# Patient Record
Sex: Female | Born: 1970 | Race: Asian | Hispanic: No | Smoking: Never smoker
Health system: Southern US, Community
[De-identification: ages and names within clinical notes are randomized; demographics above are authoritative.]

## PROBLEM LIST (undated history)

## (undated) ENCOUNTER — Emergency Department (HOSPITAL_COMMUNITY): Discharge status: Absent without leave | Payer: Self-pay

## (undated) DIAGNOSIS — K219 Gastro-esophageal reflux disease without esophagitis: Secondary | ICD-10-CM

## (undated) DIAGNOSIS — T7840XA Allergy, unspecified, initial encounter: Secondary | ICD-10-CM

## (undated) DIAGNOSIS — C801 Malignant (primary) neoplasm, unspecified: Secondary | ICD-10-CM

## (undated) DIAGNOSIS — R233 Spontaneous ecchymoses: Secondary | ICD-10-CM

## (undated) DIAGNOSIS — B1910 Unspecified viral hepatitis B without hepatic coma: Secondary | ICD-10-CM

## (undated) DIAGNOSIS — K649 Unspecified hemorrhoids: Secondary | ICD-10-CM

## (undated) DIAGNOSIS — E079 Disorder of thyroid, unspecified: Secondary | ICD-10-CM

## (undated) DIAGNOSIS — R200 Anesthesia of skin: Secondary | ICD-10-CM

## (undated) DIAGNOSIS — R238 Other skin changes: Secondary | ICD-10-CM

## (undated) DIAGNOSIS — R0602 Shortness of breath: Secondary | ICD-10-CM

## (undated) HISTORY — DX: Unspecified hemorrhoids: K64.9

## (undated) HISTORY — DX: Allergy, unspecified, initial encounter: T78.40XA

## (undated) HISTORY — DX: Unspecified viral hepatitis B without hepatic coma: B19.10

## (undated) HISTORY — DX: Disorder of thyroid, unspecified: E07.9

## (undated) HISTORY — DX: Malignant (primary) neoplasm, unspecified: C80.1

## (undated) HISTORY — PX: OTHER SURGICAL HISTORY: SHX169

---

## 1999-05-02 ENCOUNTER — Other Ambulatory Visit: Admission: RE | Admit: 1999-05-02 | Discharge: 1999-05-02 | Payer: Self-pay | Admitting: *Deleted

## 2000-04-26 ENCOUNTER — Other Ambulatory Visit: Admission: RE | Admit: 2000-04-26 | Discharge: 2000-04-26 | Payer: Self-pay | Admitting: *Deleted

## 2001-05-30 ENCOUNTER — Other Ambulatory Visit: Admission: RE | Admit: 2001-05-30 | Discharge: 2001-05-30 | Payer: Self-pay | Admitting: Obstetrics and Gynecology

## 2002-06-23 ENCOUNTER — Other Ambulatory Visit: Admission: RE | Admit: 2002-06-23 | Discharge: 2002-06-23 | Payer: Self-pay | Admitting: Obstetrics and Gynecology

## 2003-05-04 ENCOUNTER — Encounter: Admission: RE | Admit: 2003-05-04 | Discharge: 2003-05-04 | Payer: Self-pay | Admitting: *Deleted

## 2003-05-04 ENCOUNTER — Encounter: Payer: Self-pay | Admitting: Gastroenterology

## 2003-05-11 ENCOUNTER — Encounter: Admission: RE | Admit: 2003-05-11 | Discharge: 2003-05-11 | Payer: Self-pay | Admitting: Gastroenterology

## 2006-11-05 ENCOUNTER — Encounter: Admission: RE | Admit: 2006-11-05 | Discharge: 2006-11-05 | Payer: Self-pay | Admitting: Gastroenterology

## 2007-01-29 ENCOUNTER — Ambulatory Visit: Payer: Self-pay | Admitting: Gastroenterology

## 2007-03-06 ENCOUNTER — Ambulatory Visit (HOSPITAL_COMMUNITY): Admission: RE | Admit: 2007-03-06 | Discharge: 2007-03-06 | Payer: Self-pay | Admitting: Gastroenterology

## 2008-11-19 ENCOUNTER — Encounter: Admission: RE | Admit: 2008-11-19 | Discharge: 2008-11-19 | Payer: Self-pay | Admitting: Family Medicine

## 2008-12-09 ENCOUNTER — Encounter: Admission: RE | Admit: 2008-12-09 | Discharge: 2008-12-09 | Payer: Self-pay | Admitting: Family Medicine

## 2010-04-08 ENCOUNTER — Ambulatory Visit: Payer: Self-pay | Admitting: Family Medicine

## 2010-04-08 LAB — CONVERTED CEMR LAB
CO2: 22 meq/L (ref 19–32)
Calcium: 9 mg/dL (ref 8.4–10.5)
Chloride: 104 meq/L (ref 96–112)
Creatinine, Ser: 0.5 mg/dL (ref 0.40–1.20)
Eosinophils Absolute: 0.1 10*3/uL (ref 0.0–0.7)
Eosinophils Relative: 1 % (ref 0–5)
Free T4: 1.04 ng/dL (ref 0.80–1.80)
Glucose, Bld: 78 mg/dL (ref 70–99)
HCT: 39.3 % (ref 36.0–46.0)
Hemoglobin: 13 g/dL (ref 12.0–15.0)
Lymphs Abs: 2.7 10*3/uL (ref 0.7–4.0)
MCV: 87.1 fL (ref 78.0–100.0)
Monocytes Relative: 6 % (ref 3–12)
RBC: 4.51 M/uL (ref 3.87–5.11)
Sodium: 137 meq/L (ref 135–145)
TSH: 0.636 microintl units/mL (ref 0.350–4.500)
Total Bilirubin: 0.4 mg/dL (ref 0.3–1.2)
Total Protein: 7.4 g/dL (ref 6.0–8.3)
Vit D, 25-Hydroxy: 16 ng/mL — ABNORMAL LOW (ref 30–89)
WBC: 6 10*3/uL (ref 4.0–10.5)

## 2010-05-20 ENCOUNTER — Encounter (INDEPENDENT_AMBULATORY_CARE_PROVIDER_SITE_OTHER): Payer: Self-pay | Admitting: *Deleted

## 2010-05-20 LAB — CONVERTED CEMR LAB
ALT: 9 units/L (ref 0–35)
BUN: 8 mg/dL (ref 6–23)
CO2: 26 meq/L (ref 19–32)
Calcium: 9.4 mg/dL (ref 8.4–10.5)
Creatinine, Ser: 0.55 mg/dL (ref 0.40–1.20)
Total Bilirubin: 0.8 mg/dL (ref 0.3–1.2)

## 2011-03-21 ENCOUNTER — Inpatient Hospital Stay (INDEPENDENT_AMBULATORY_CARE_PROVIDER_SITE_OTHER)
Admission: RE | Admit: 2011-03-21 | Discharge: 2011-03-21 | Disposition: A | Discharge status: Home / Self Care | Payer: Self-pay | Source: Ambulatory Visit | Attending: Emergency Medicine | Admitting: Emergency Medicine

## 2011-03-21 DIAGNOSIS — R198 Other specified symptoms and signs involving the digestive system and abdomen: Secondary | ICD-10-CM

## 2011-03-21 LAB — OCCULT BLOOD, POC DEVICE: Fecal Occult Bld: NEGATIVE

## 2011-06-30 ENCOUNTER — Other Ambulatory Visit: Payer: Self-pay | Admitting: Family Medicine

## 2011-10-12 ENCOUNTER — Other Ambulatory Visit: Payer: Self-pay | Admitting: Obstetrics and Gynecology

## 2011-10-12 DIAGNOSIS — Z1231 Encounter for screening mammogram for malignant neoplasm of breast: Secondary | ICD-10-CM

## 2011-10-17 ENCOUNTER — Ambulatory Visit (INDEPENDENT_AMBULATORY_CARE_PROVIDER_SITE_OTHER): Payer: Self-pay | Admitting: *Deleted

## 2011-10-17 ENCOUNTER — Ambulatory Visit (HOSPITAL_COMMUNITY)
Admission: RE | Admit: 2011-10-17 | Discharge: 2011-10-17 | Disposition: A | Discharge status: Home / Self Care | Payer: Self-pay | Source: Ambulatory Visit | Attending: Obstetrics and Gynecology | Admitting: Obstetrics and Gynecology

## 2011-10-17 VITALS — BP 141/95 | HR 85 | Temp 97.1°F | Ht 62.0 in | Wt 134.9 lb

## 2011-10-17 DIAGNOSIS — Z1239 Encounter for other screening for malignant neoplasm of breast: Secondary | ICD-10-CM

## 2011-10-17 DIAGNOSIS — Z1231 Encounter for screening mammogram for malignant neoplasm of breast: Secondary | ICD-10-CM

## 2011-10-17 NOTE — Patient Instructions (Signed)
Taught patient how to perform BSE and gave educational materials to take home. Patient did not need a Pap smear today due to last Pap smear was 06/30/11. Told patient about free cervical cancer screenings to receive a Pap smear if would like one next year. Let her know [facility] will cover Pap smears every 3 years unless has a history of abnormal Pap smears. Patient is escorted to mammography for a screening mammogram. Talked with patient about her BP. Patients BP was 139/91. Encouraged patient to keep a log of her BP's and have taken several times. Gave patient a sheet to use to keep log. Also, talked with patient about increasing physical activity and watching salt intake. Encouraged patient to talk to her PCP about BP.  Let patient know will follow up with her within the next couple weeks with results. Patient verbalized understanding.

## 2011-10-17 NOTE — Progress Notes (Signed)
Complaints of occasional breast tenderness.  Pap Smear:    Pap smear not performed today. Patients last Pap smear was 06/30/11 at [facility] and was normal. Per patient she has no history of abnormal Pap smears. Pap smear result above is in EPIC.  Physical exam: Breasts Breasts symmetrical. No skin abnormalities bilateral breasts. Right nipple inversion that is normal per patient. No nipple retraction left breast. No nipple discharge bilateral breasts. No lymphadenopathy. No lumps palpated bilateral breasts. Patient complained of tenderness of bilateral breasts on exam.       Pelvic/Bimanual No Pap smear completed today since last Pap smear was 06/30/11 and normal. Pap smear not indicated per [facility] guidelines.

## 2012-10-18 ENCOUNTER — Ambulatory Visit (INDEPENDENT_AMBULATORY_CARE_PROVIDER_SITE_OTHER): Payer: Self-pay | Admitting: Obstetrics and Gynecology

## 2012-10-18 ENCOUNTER — Encounter: Payer: Self-pay | Admitting: Obstetrics and Gynecology

## 2012-10-18 ENCOUNTER — Encounter: Payer: Self-pay | Admitting: *Deleted

## 2012-10-18 VITALS — BP 134/87 | HR 82 | Temp 97.5°F | Ht 62.0 in | Wt 127.0 lb

## 2012-10-18 DIAGNOSIS — N930 Postcoital and contact bleeding: Secondary | ICD-10-CM | POA: Insufficient documentation

## 2012-10-18 DIAGNOSIS — Z01419 Encounter for gynecological examination (general) (routine) without abnormal findings: Secondary | ICD-10-CM

## 2012-10-18 LAB — WET PREP, GENITAL
Clue Cells Wet Prep HPF POC: NONE SEEN
Trich, Wet Prep: NONE SEEN
WBC, Wet Prep HPF POC: NONE SEEN
Yeast Wet Prep HPF POC: NONE SEEN

## 2012-10-18 MED ORDER — NORGESTIMATE-ETH ESTRADIOL 0.25-35 MG-MCG PO TABS: 1.0000 | ORAL_TABLET | Freq: Every day | ORAL | Status: DC

## 2012-10-18 NOTE — Addendum Note (Signed)
Addended by: Franchot Mimes on: 10/18/2012 12:18 PM   Modules accepted: Orders

## 2012-10-18 NOTE — Progress Notes (Signed)
  Subjective:     Michelle Hammond is a 42 y.o. female G1P1 with BMI 24 who is here for a comprehensive physical exam. The patient reports post coital vaginal bleeding lasting 1 day. She has normal monthly cycles which last 3-4 days. Patient has not been sexually active in over 10 years and is using condoms for birth control. She is quite frightened by this bleeding and is currently abstaining from intercourse. Patient is otherwise without any complaints.  History   Social History  . Marital Status: Single    Spouse Name: N/A    Number of Children: N/A  . Years of Education: N/A   Occupational History  . Not on file.   Social History Main Topics  . Smoking status: Never Smoker   . Smokeless tobacco: Never Used  . Alcohol Use: No  . Drug Use: No  . Sexually Active: Not Currently    Birth Control/ Protection: None   Other Topics Concern  . Not on file   Social History Narrative  . No narrative on file   Health Maintenance  Topic Date Due  . Tetanus/tdap  08/25/1987  . Influenza Vaccine  03/10/2013  . Pap Smear  06/29/2014   Past Medical History  Diagnosis Date  . Hepatitis B   . Asthma    History reviewed. No pertinent past surgical history.  Family History  Problem Relation Age of Onset  . Hypertension Mother   . Kidney disease Father       Review of Systems A comprehensive review of systems was negative.   Objective:      GENERAL: Well-developed, well-nourished female in no acute distress.  HEENT: Normocephalic, atraumatic. Sclerae anicteric.  NECK: Supple. Normal thyroid.  LUNGS: Clear to auscultation bilaterally.  HEART: Regular rate and rhythm. BREASTS: Symmetric in size. No palpable masses or lymphadenopathy, skin changes, or nipple drainage. ABDOMEN: Soft, nontender, nondistended. No organomegaly. PELVIC: Normal external female genitalia. Vagina is pink and rugated.  Normal discharge. Normal appearing cervix but friable to touch. Uterus is normal in  size. No adnexal mass or tenderness. EXTREMITIES: No cyanosis, clubbing, or edema, 2+ distal pulses.    Assessment:    Healthy female exam.      Plan:    Pap smear and wet prep collected Patient will be contacted with any abnormal results Patient requested to be started back on OCP. She has used them in the past without any issues Discussed use of different condom brands to determine if postcoital bleeding due to latex sensitivity Patient completed scholarship form for screening mammogram Patient to continue performing monthly self breast and vulva exam RTC in 1 year or prn See After Visit Summary for Counseling Recommendations

## 2012-10-23 ENCOUNTER — Telehealth: Payer: Self-pay | Admitting: *Deleted

## 2012-10-23 NOTE — Telephone Encounter (Signed)
Patient left a message requesting that a nurse call her back. I returned her call and left a voicemail for her that I am returning your call.

## 2012-10-23 NOTE — Telephone Encounter (Signed)
Patient calling for pap results. I informed her that the results are not yet back but that she would get a letter with results or a call if we needed to let her know something. Pt stated that she understands results.

## 2012-10-24 ENCOUNTER — Other Ambulatory Visit: Payer: Self-pay | Admitting: Obstetrics and Gynecology

## 2012-10-24 DIAGNOSIS — Z1231 Encounter for screening mammogram for malignant neoplasm of breast: Secondary | ICD-10-CM

## 2012-11-08 ENCOUNTER — Encounter (HOSPITAL_COMMUNITY): Payer: Self-pay

## 2012-11-08 ENCOUNTER — Emergency Department (HOSPITAL_COMMUNITY)
Admission: EM | Admit: 2012-11-08 | Discharge: 2012-11-08 | Disposition: A | Discharge status: Home / Self Care | Payer: No Typology Code available for payment source | Source: Home / Self Care

## 2012-11-08 DIAGNOSIS — J309 Allergic rhinitis, unspecified: Secondary | ICD-10-CM

## 2012-11-08 DIAGNOSIS — J302 Other seasonal allergic rhinitis: Secondary | ICD-10-CM

## 2012-11-08 DIAGNOSIS — J45909 Unspecified asthma, uncomplicated: Secondary | ICD-10-CM

## 2012-11-08 MED ORDER — CETIRIZINE HCL 10 MG PO TABS: 10.0000 mg | ORAL_TABLET | Freq: Every day | ORAL | Status: DC

## 2012-11-08 MED ORDER — ALBUTEROL SULFATE HFA 108 (90 BASE) MCG/ACT IN AERS: 2.0000 | INHALATION_SPRAY | RESPIRATORY_TRACT | Status: DC | PRN

## 2012-11-08 MED ORDER — FLUTICASONE-SALMETEROL 250-50 MCG/DOSE IN AEPB: 1.0000 | INHALATION_SPRAY | Freq: Two times a day (BID) | RESPIRATORY_TRACT | Status: DC

## 2012-11-08 MED ORDER — OXYMETAZOLINE HCL 0.05 % NA SOLN: 2.0000 | Freq: Two times a day (BID) | NASAL | Status: DC

## 2012-11-08 MED ORDER — FLUTICASONE PROPIONATE 50 MCG/ACT NA SUSP: 2.0000 | Freq: Every day | NASAL | Status: DC

## 2012-11-08 NOTE — Progress Notes (Signed)
Patient Demographics  Michelle Hammond, is a 42 y.o. female  ZOX:[id number]  [id number]:[id number]  DOB - 1971-01-03  Chief Complaint  Patient presents with  . Asthma        Subjective:   Michelle Hammond today is here to establish primary care. She has a history of bronchial asthma and seasonal allergies. She recently had post coital bleeding and was [facility] where she had workup and a Pap smear-results of the past but currently pending. She recently was placed on OCP by her GYN. Her only complaint here today is nasal congestion, runny eyes and itchy throat. She thinks she is wheezing however her lungs are completely clear on exam.  Patient has No headache, No chest pain, No abdominal pain - No Nausea, No new weakness tingling or numbness, No Cough - SOB.   Objective:    Filed Vitals:   11/08/12 1653  BP: 132/93  Pulse: 87  Temp: 98.4 F (36.9 C)  TempSrc: Oral  Resp: 16  SpO2: 100%     ALLERGIES:  No Known Allergies  PAST MEDICAL HISTORY: Past Medical History  Diagnosis Date  . Hepatitis B   . Asthma     PAST SURGICAL HISTORY: History reviewed. No pertinent past surgical history.  FAMILY HISTORY: Family History  Problem Relation Age of Onset  . Hypertension Mother   . Kidney disease Father     MEDICATIONS AT HOME: Prior to Admission medications   Medication Sig Start Date End Date Taking? Authorizing Provider  albuterol (PROVENTIL HFA;VENTOLIN HFA) 108 (90 BASE) MCG/ACT inhaler Inhale 2 puffs into the lungs every 4 (four) hours as needed for wheezing. 11/08/12   Viral Schramm Levora Dredge, MD  cetirizine (ZYRTEC) 10 MG tablet Take 1 tablet (10 mg total) by mouth daily. 11/08/12   Carlota Philley Levora Dredge, MD  fluticasone (FLONASE) 50 MCG/ACT nasal spray Place 2 sprays into the nose daily. 11/08/12   Bejamin Hackbart Levora Dredge, MD  Fluticasone-Salmeterol (ADVAIR) 250-50 MCG/DOSE AEPB Inhale 1 puff into the lungs every 12 (twelve) hours. 11/08/12   Addysyn Fern Levora Dredge, MD   norgestimate-ethinyl estradiol (ORTHO-CYCLEN,SPRINTEC,PREVIFEM) 0.25-35 MG-MCG tablet Take 1 tablet by mouth daily. 10/18/12   Peggy Constant, MD  oxymetazoline (AFRIN NASAL SPRAY) 0.05 % nasal spray Place 2 sprays into the nose 2 (two) times daily. Use for 3 days and then stop 11/08/12   Maretta Bees, MD  pseudoephedrine-acetaminophen (TYLENOL SINUS) 30-500 MG TABS Take 1 tablet by mouth every 4 (four) hours as needed.    Historical Provider, MD    REVIEW OF SYSTEMS:  Constitutional:   No   Fevers, chills, fatigue.  HEENT:    No headaches, Sore throat,   Cardio-vascular: No chest pain,  Orthopnea, swelling in lower extremities, anasarca, palpitations  GI:  No abdominal pain, nausea, vomiting, diarrhea  Resp: No shortness of breath,  No coughing up of blood.No cough.No wheezing.  Skin:  no rash or lesions.  GU:  no dysuria, change in color of urine, no urgency or frequency.  No flank pain.  Musculoskeletal: No joint pain or swelling.  No decreased range of motion.  No back pain.  Psych: No change in mood or affect. No depression or anxiety.  No memory loss.   Exam  General appearance :Awake, alert, not in any distress. Speech Clear. Not toxic Looking HEENT: Atraumatic and Normocephalic, pupils equally reactive to light and accomodation Neck: supple, no JVD. No cervical lymphadenopathy.  Chest:Good air entry bilaterally, no added sounds  CVS: S1 S2  regular, no murmurs.  Abdomen: Bowel sounds present, Non tender and not distended with no gaurding, rigidity or rebound. Extremities: B/L Lower Ext shows no edema, both legs are warm to touch Neurology: Awake alert, and oriented X 3, CN II-XII intact, Non focal Skin:No Rash Wounds:N/A    Data Review   CBC No results found for this basename: WBC, HGB, HCT, PLT, MCV, MCH, MCHC, RDW, NEUTRABS, LYMPHSABS, MONOABS, EOSABS, BASOSABS, BANDABS, BANDSABD,  in the last 168 hours  Chemistries   No results found for this  basename: NA, K, CL, CO2, GLUCOSE, BUN, CREATININE, GFRCGP, CALCIUM, MG, AST, ALT, ALKPHOS, BILITOT,  in the last 168 hours ------------------------------------------------------------------------------------------------------------------ No results found for this basename: HGBA1C,  in the last 72 hours ------------------------------------------------------------------------------------------------------------------ No results found for this basename: CHOL, HDL, LDLCALC, TRIG, CHOLHDL, LDLDIRECT,  in the last 72 hours ------------------------------------------------------------------------------------------------------------------ No results found for this basename: TSH, T4TOTAL, FREET3, T3FREE, THYROIDAB,  in the last 72 hours ------------------------------------------------------------------------------------------------------------------ No results found for this basename: VITAMINB12, FOLATE, FERRITIN, TIBC, IRON, RETICCTPCT,  in the last 72 hours  Coagulation profile  No results found for this basename: INR, PROTIME,  in the last 168 hours    Assessment & Plan   Active Problems: Bronchial asthma - Currrently with no evidence of any flare-lungs are completely clear on exam patient seems very comfortable - She is asking for prednisone taper-I believe at this point no role for steroids. I asked her to call the clinic if she were to actually have shortness of breath/wheezing. I think her seasonal allergies may be flaring up which may be causing her to be anxious and causing some nasal congestion and itchiness in her throat that may be mimicking wheezing. - Refill albuterol inhaler and Advair with 2 refills  Seasonal allergies - Suspect allergic rhinitis - Will place on cetirizine, Flonase and Afrin Spray for 3 days only -Reassess next visit   Follow-up Information   Follow up with [facility]. Schedule an appointment as soon as possible for a visit in 1 month.

## 2012-11-08 NOTE — ED Notes (Signed)
Patient has a history of asthma and out of her medications and inhaler

## 2012-11-12 ENCOUNTER — Ambulatory Visit (HOSPITAL_COMMUNITY)
Admission: RE | Admit: 2012-11-12 | Discharge: 2012-11-12 | Disposition: A | Discharge status: Home / Self Care | Payer: No Typology Code available for payment source | Source: Ambulatory Visit | Attending: Obstetrics and Gynecology | Admitting: Obstetrics and Gynecology

## 2012-11-12 ENCOUNTER — Telehealth: Payer: Self-pay | Admitting: *Deleted

## 2012-11-12 DIAGNOSIS — Z1231 Encounter for screening mammogram for malignant neoplasm of breast: Secondary | ICD-10-CM | POA: Insufficient documentation

## 2012-11-12 NOTE — Telephone Encounter (Signed)
Pt left message stating that she was seen @ clinic on 4/11 and started on OCP's. She is experiencing breast tenderness. Please call back.  **Note:  Pt had neg pregnancy test on 5/2 @ [facility].  I returned pt's call and discussed her concerns. She stated that she did not take her OCP last night because of the breast tenderness. She is currently 2 weeks into the pack. I advised pt that some breast tenderness is normal and she needs to continue the pills as directed. In order to catch up, she should take last night's pill now, then resume her regular schedule this evening. Pt asked about switching to another pill but I stated this is not necessary. She should wait until she has completed atleast 2 packs of pills to see if her body will be accustomed to the hormones as this is a normal side effect. Pt also asked about her Pap result and we discussed that it was normal as well as the additional tests performed (GC/Chlamydia & HPV) were normal. Pt has appt this evening for mammo and will keep as scheduled. Pt voiced understanding of all information.

## 2012-11-15 NOTE — ED Notes (Signed)
Prescription given for prednisone  10 mg daily take for 10 days po daily

## 2012-11-19 ENCOUNTER — Telehealth: Payer: Self-pay | Admitting: General Practice

## 2012-11-19 NOTE — Telephone Encounter (Signed)
Pt says she previously received 40 mg of meds but for last visit was prescribed 10 mg and says it is not enough.  Please call pt back with info as to why script has changed.

## 2012-11-21 NOTE — Telephone Encounter (Signed)
Pt came in person and Dr. Lucretia Roers informed her that her dosage cannot be increased, that the prescription she received is accurate.  Also pt was informed that if she feels like she has shortness of breath or has trouble breathing that she should go to the emergency or urgent care center.

## 2013-04-29 ENCOUNTER — Ambulatory Visit: Payer: No Typology Code available for payment source | Attending: Internal Medicine | Admitting: Internal Medicine

## 2013-04-29 VITALS — BP 154/99 | HR 94 | Temp 98.5°F | Resp 17

## 2013-04-29 DIAGNOSIS — R109 Unspecified abdominal pain: Secondary | ICD-10-CM

## 2013-04-29 DIAGNOSIS — B9789 Other viral agents as the cause of diseases classified elsewhere: Secondary | ICD-10-CM | POA: Insufficient documentation

## 2013-04-29 DIAGNOSIS — J309 Allergic rhinitis, unspecified: Secondary | ICD-10-CM

## 2013-04-29 DIAGNOSIS — J302 Other seasonal allergic rhinitis: Secondary | ICD-10-CM

## 2013-04-29 DIAGNOSIS — J45909 Unspecified asthma, uncomplicated: Secondary | ICD-10-CM

## 2013-04-29 DIAGNOSIS — J069 Acute upper respiratory infection, unspecified: Secondary | ICD-10-CM

## 2013-04-29 MED ORDER — FLUTICASONE PROPIONATE 50 MCG/ACT NA SUSP: 2.0000 | Freq: Every day | NASAL | Status: DC

## 2013-04-29 MED ORDER — PSEUDOEPHEDRINE-ACETAMINOPHEN 30-500 MG PO TABS: 1.0000 | ORAL_TABLET | Freq: Four times a day (QID) | ORAL | Status: DC | PRN

## 2013-04-29 MED ORDER — CETIRIZINE HCL 10 MG PO TABS: 10.0000 mg | ORAL_TABLET | Freq: Every day | ORAL | Status: DC

## 2013-04-29 NOTE — Progress Notes (Signed)
Patient ID: Michelle Hammond, female   DOB: 07-May-1971, 42 y.o.   MRN: [id number]  Complaint Nasal congestion itchy throat and bodyaches for possible weeks off-and-on  History of present illness 42 year old [place] female with history of bronchial asthma and seasonal allergies comes in for nasal congestion, itchy throat and some chest congestion for 2 weeks off-and-on. She reports having body aches as well. Does report some subjective fever yesterday. She also has symptoms of right lower quadrant dull aching pain for possible disease which is nonradiating. Denies any bowel or urinary symptoms. Denies nausea, vomiting, shortness of breath, urinary symptoms. Denies joint pains. 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  Vital signs in last 24 hours:  Filed Vitals:   04/29/13 1602  BP: 154/99  Pulse: 94  Temp: 98.5 F (36.9 C)  Resp: 17  SpO2: 100%      Physical Exam:  General: Middle-aged female in no acute distress. HEENT: no pallor, no icterus, moist oral mucosa, , no lymphadenopathy Heart: Normal  s1 &s2  Regular rate and rhythm, without murmurs, rubs, gallops. Lungs: Clear to auscultation bilaterally. Abdomen: Soft, nontender, nondistended, positive bowel sounds. Tender to deep palpation over right lower quadrant Extremities: Warm, no edema Neuro: Alert, awake, oriented x3, nonfocal.   Lab Results:  Basic Metabolic Panel:    Component Value Date/Time   NA 141 05/20/2010 1946   K 4.6 05/20/2010 1946   CL 106 05/20/2010 1946   CO2 26 05/20/2010 1946   BUN 8 05/20/2010 1946   CREATININE 0.55 05/20/2010 1946   GLUCOSE 88 05/20/2010 1946   CALCIUM 9.4 05/20/2010 1946   CBC:    Component Value Date/Time   WBC 6.0 04/08/2010 2126   HGB 13.0 04/08/2010 2126   HCT 39.3 04/08/2010 2126   PLT 287 04/08/2010 2126   MCV 87.1 04/08/2010 2126   NEUTROABS 2.9 04/08/2010 2126   LYMPHSABS 2.7 04/08/2010 2126   MONOABS 0.4 04/08/2010 2126   EOSABS  0.1 04/08/2010 2126   BASOSABS 0.0 04/08/2010 2126    No results found for this or any previous visit (from the past 240 hour(s)).  Studies/Results: No results found.  Medications: Scheduled Meds: Continuous Infusions: PRN Meds:.    Assessment/Plan:  Viral URI with associated seasonal allergies I will give her a prescription for CBC and Flonase and Tylenol Sinus. I will order an ultrasound of her abdomen to evaluate right lower quadrant pain. - Vision instructed to keep herself warm, drinking the water and steam inhalation for symptomatic relief .  Patient reports getting a flu vaccine one month back   Michelle Hammond 04/29/2013, 4:35 PM

## 2013-04-29 NOTE — Progress Notes (Signed)
Patient is here for body aches past two weeks Chest congestion headache fever and sore throat Also complain of lower right quadrant pain past two days

## 2013-04-29 NOTE — Addendum Note (Signed)
Addended by: Eddie North on: 04/29/2013 04:43 PM   Modules accepted: Orders

## 2013-05-01 ENCOUNTER — Ambulatory Visit (HOSPITAL_COMMUNITY)
Admission: RE | Admit: 2013-05-01 | Discharge: 2013-05-01 | Disposition: A | Discharge status: Home / Self Care | Payer: No Typology Code available for payment source | Source: Ambulatory Visit | Attending: Internal Medicine | Admitting: Internal Medicine

## 2013-05-01 ENCOUNTER — Other Ambulatory Visit: Payer: Self-pay | Admitting: Internal Medicine

## 2013-05-01 DIAGNOSIS — R1032 Left lower quadrant pain: Secondary | ICD-10-CM

## 2013-05-01 DIAGNOSIS — R109 Unspecified abdominal pain: Secondary | ICD-10-CM | POA: Insufficient documentation

## 2013-05-01 DIAGNOSIS — D259 Leiomyoma of uterus, unspecified: Secondary | ICD-10-CM | POA: Insufficient documentation

## 2013-05-08 ENCOUNTER — Ambulatory Visit: Payer: No Typology Code available for payment source

## 2013-12-15 ENCOUNTER — Other Ambulatory Visit: Payer: Self-pay | Admitting: Internal Medicine

## 2013-12-15 DIAGNOSIS — C73 Malignant neoplasm of thyroid gland: Secondary | ICD-10-CM

## 2013-12-16 ENCOUNTER — Ambulatory Visit (INDEPENDENT_AMBULATORY_CARE_PROVIDER_SITE_OTHER): Payer: BC Managed Care – PPO | Admitting: Internal Medicine

## 2013-12-16 ENCOUNTER — Telehealth: Payer: Self-pay | Admitting: Hematology and Oncology

## 2013-12-16 ENCOUNTER — Encounter: Payer: Self-pay | Admitting: Internal Medicine

## 2013-12-16 VITALS — BP 128/88 | HR 91 | Temp 98.4°F | Resp 12 | Ht 61.5 in | Wt 139.0 lb

## 2013-12-16 DIAGNOSIS — C73 Malignant neoplasm of thyroid gland: Secondary | ICD-10-CM

## 2013-12-16 NOTE — Telephone Encounter (Signed)
S/W PATIENT DTR(MICHELLE) AND GAVE NP APPT FOR 06/26 @ 1045 W/DR. [place].  REFERRING DR. 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

## 2013-12-16 NOTE — Patient Instructions (Signed)
Please stop at the lab. We will refer you to surgery.  I will need to see you back in ~1 month after the surgery.  Thyroid Cancer  The thyroid gland is a butterfly-shaped gland in the middle of the neck, located just below the voice box. It makes thyroid hormone. Thyroid hormone has an effect on nearly all tissues of your body by regulating your metabolism. Metabolism is the breakdown and use of food that you eat or energy that is stored in your body. Your metabolism affects your heart rate, blood pressure, body temperature, and weight. Thyroid cancer occurs when cells in your thyroid gland begin to form abnormally (mutate). This mutation allows the abnormally formed cells to grow and multiply rapidly. These abnormal cells do not die as normal cells do. The accumulation of these abnormal cells forms a cancerous tumor.  There are 4 main types of thyroid cancer: 1. Papillary cancer. This is the least harmful type of thyroid cancer. Typically, it affects women of child-bearing age. It can be caused by exposure to radiation. 2. Follicular cancer. Follicular cancer accounts for about 20% of all thyroid cancers and is more likely to recur (come back after treatment) and spread. 3. Medullary cancer. Although most thyroid cancers are not passed down through the genes of family members (genetic), this type of thyroid cancer can be inherited. If your family members have this gene for this cancer, you can be tested for it. This cancer develops in most people who have this gene. 4. Anaplastic cancer. This is the rarest and most harmful (malignant) type of thyroid cancer. It spreads quickly to structures such as your windpipe (trachea), causing compression of your trachea and breathing difficulties. It is such an aggressive type of cancer that it is difficult to completely get rid of. Anaplastic thyroid cancer is more common in people 11 years old or older. RISK FACTORS  Radiation exposure or radiation treatments  to your head and neck during infancy or childhood.  Enlarged thyroid.  Family history of thyroid disease.  Female sex.  Asian race. SYMPTOMS   Enlargement of your thyroid gland or lumps or swelling in your neck.  Hoarseness or a change in your voice.  Cough or coughing up blood.  Difficulty swallowing.  Shortness of breath if your trachea is compressed. DIAGNOSIS  Your caregiver will physically examine your thyroid. If a lump is found, an ultrasound test may be done. Your caregiver may order blood tests to see if a lump is causing your thyroid to make too much thyroid hormone. Sometimes a biopsy is performed. This is the removal of a small piece of tissue for examination under a microscope by a specialist Armed forces logistics/support/administrative officer). This is often done with a very fine needle. The pathologist can tell if the tissue sample contains cancer. TREATMENT  Some types of thyroid cancer grow faster than others. The success of treatment depends on the type of thyroid cancer, whether it has spread to other parts of the body, and the patient's age and overall health. Most thyroid cancers can be treated successfully. Typically, removal of most or all of the thyroid gland (thyroidectomy) is the course of treatment for most thyroid cancers. In some cases it is necessary to remove lymph nodes in the neck that are close to the thyroid. Often, follow-up procedures are necessary to ensure that all the cancer is eliminated and to keep it from recurring. These procedures include:   Thyroid hormone therapy. This can be done by taking a pill. This  medication serves 2 purposes:  It replaces the hormone in your body that is normally made by your thyroid.  It suppresses a thyroid stimulating hormone, a hormone that activates your thyroid but also makes any remaining cancer cells grow.  Radioactive iodine treatment. This treatment often is used to destroy any remaining thyroid tissue and cancerous tissue that could not be  seen and was not removed during the thyroidectomy. This treatment also may be used to treat thyroid cancer that has spread to other parts of the body or has recurred.  External radiation. This typically is used to treat thyroid cancer that has spread to your bones. The treatment is administered a few minutes at a time, 5 days per week, for 5 to 6 weeks. PREVENTION  In some people who have the gene linked to medullary cancer, thyroidectomy is done to prevent cancer from developing. Document Released: 05/19/2004 Document Revised: 09/18/2011 Document Reviewed: 09/01/2010 [facility] Patient Information 2014 [place], [place].

## 2013-12-16 NOTE — Progress Notes (Signed)
Patient ID: Michelle Hammond, female   DOB: 1971-03-28, 43 y.o.   MRN: [id number]   HPI  Michelle Hammond is a 43 y.o.-year-old female, referred by her PCP, Dr.Jegede, for evaluation for papillary thyroid cancer. She is here with her daughter and the interpreter.  Pt. has been found to have 2 L thyroid nodules (one of 7 x 10 mm and one of 13 x 14 mm, no calcifications) on thyroid U/S 11/14/2013 in [place] >> had a thyroid nodule FNA on 11/30/2013 (unclear of which of the 2 nodules) >> papillary thyroid carcinoma. She brings records but all are in [place]. The interpreter translated the reports for [place]. The word carcinoma is easily distinguishable on the report.  I reviewed pt's thyroid tests: 2012: TSH 0.628 Lab Results  Component Value Date   TSH 0.636 04/08/2010   FREET4 1.04 04/08/2010   Pt describes: - fatigue - + hot flushes - + palpitations - No tremors - no weight gain - + constipation/+ diarrhea  - + dry skin - + hair falling - no depression  Pt denies feeling nodules in neck, hoarseness, dysphagia/odynophagia, SOB with lying down.  She has + FH of thyroid disorders in mother and niece. No FH of thyroid cancer.  No h/o radiation tx to head or neck. No recent use of iodine supplements.  ROS: Constitutional: no weight gain/loss, + fatigue, no subjective hyperthermia/hypothermia Eyes: no blurry vision, no xerophthalmia ENT: no sore throat, no nodules palpated in throat, no dysphagia/odynophagia, no hoarseness Cardiovascular: no CP/SOB/palpitations/leg swelling Respiratory: no cough/SOB Gastrointestinal: no N/V/D/C, + acid reflux Musculoskeletal: no muscle/joint aches Skin: no rashes Neurological: no tremors/numbness/tingling/dizziness Psychiatric: no depression/anxiety  Past Medical History  Diagnosis Date  . Hepatitis B   . Asthma   . Thyroid disease   . Allergy     SEASONAL   . Hemorrhoids    History reviewed. No pertinent past surgical history. History    Social History  . Marital Status: Single    Spouse Name: N/A    Number of Children: 1   Occupational History  . cleaning   Social History Main Topics  . Smoking status: Never Smoker   . Smokeless tobacco: Never Used  . Alcohol Use: No  . Drug Use: No  . Sexual Activity: Not Currently    Birth Control/ Protection: None   Current Outpatient Prescriptions on File Prior to Visit  Medication Sig Dispense Refill  . albuterol (PROVENTIL HFA;VENTOLIN HFA) 108 (90 BASE) MCG/ACT inhaler Inhale 2 puffs into the lungs every 4 (four) hours as needed for wheezing.  6.7 g  2  . cetirizine (ZYRTEC) 10 MG tablet Take 1 tablet (10 mg total) by mouth daily.  30 tablet  1  . fluticasone (FLONASE) 50 MCG/ACT nasal spray Place 2 sprays into the nose daily.  16 g  2  . Fluticasone-Salmeterol (ADVAIR) 250-50 MCG/DOSE AEPB Inhale 1 puff into the lungs every 12 (twelve) hours.  60 each  2  . norgestimate-ethinyl estradiol (ORTHO-CYCLEN,SPRINTEC,PREVIFEM) 0.25-35 MG-MCG tablet Take 1 tablet by mouth daily.  1 Package  11  . oxymetazoline (AFRIN NASAL SPRAY) 0.05 % nasal spray Place 2 sprays into the nose 2 (two) times daily. Use for 3 days and then stop  30 mL  0  . pseudoephedrine-acetaminophen (TYLENOL SINUS) 30-500 MG TABS Take 1 tablet by mouth every 6 (six) hours as needed.  60 tablet  0   No current facility-administered medications on file prior to visit.   Allergies  Allergen  Reactions  . Aspirin Other (See Comments)    Easily bruising   Family History  Problem Relation Age of Onset  . Hypertension Mother   . Kidney disease Father    PE: BP 128/88  Pulse 91  Temp(Src) 98.4 F (36.9 C) (Oral)  Resp 12  Ht 5' 1.5" (1.562 m)  Wt 139 lb (63.05 kg)  BMI 25.84 kg/m2  SpO2 98% Wt Readings from Last 3 Encounters:  12/16/13 139 lb (63.05 kg)  10/18/12 127 lb (57.607 kg)  10/17/11 134 lb 14.4 oz (61.19 kg)   Constitutional: overweight, in NAD Eyes: PERRLA, EOMI, no exophthalmos ENT:  moist mucous membranes, no thyromegaly; firm 1 cm nodule felt in the left thyroid lobe - moves well with swallowing; no cervical lymphadenopathy Cardiovascular: RRR, No MRG Respiratory: CTA B Gastrointestinal: abdomen soft, NT, ND, BS+ Musculoskeletal: no deformities, strength intact in all 4 Skin: moist, warm, no rashes Neurological: no tremor with outstretched hands, DTR normal in all 4  ASSESSMENT: 1. Papillary thyroid cancer  PLAN:  1. PTC - I had a long discussion with the patient, her daughter, and the [place] interpreter about her recent diagnosis of papillary thyroid cancer, based on the FNA performed in [place]. We reviewed together the report of the ultrasound that clearly shows a thyroid nodule which appears to be in the left lobe. As translated by the interpreter, the report mentions the presence of 2 left thyroid nodules, and I am not sure which one was biopsied, however, the report came back as papillary thyroid cancer. I believe that the data above are clear, and we can proceed with referral to surgery. The patient wanted to know whether we need another ultrasound and biopsy, and I do not feel that this is necessary, however, I advised them that the surgeon might want to repeat them. - We discussed about the total thyroidectomy that is mandatory for this diagnosis, and the potential need for radioactive iodine treatment - We also discussed about the fact that papillary thyroid cancer is a slow growing cancer with good prognosis and the fact that she is younger than 45 years old, with place her in TNM stage 1 or 2. Her life expectancy is unlikely to be reduced due to the cancer. - I will referred her to surgery and I will see her back in approximately one month after the surgery - I explained that she would be started on levothyroxine after her thyroid is removed. I explained that thyroid hormones are mandatory and we also discussed about correct intake of levothyroxine, fasting,  with water, separated by at least 30 minutes from breakfast, and separated by more than 4 hours from calcium, iron, multivitamins, acid reflux medications (PPIs). - will check thyroid tests today: TSH, free T4, free T3, just to make sure that she is not thyrotoxic before her surgery

## 2013-12-17 LAB — TSH: TSH: 0.47 u[IU]/mL (ref 0.35–4.50)

## 2013-12-17 LAB — T3, FREE: T3, Free: 3.4 pg/mL (ref 2.3–4.2)

## 2013-12-17 LAB — T4, FREE: FREE T4: 0.71 ng/dL (ref 0.60–1.60)

## 2013-12-23 ENCOUNTER — Telehealth: Payer: Self-pay | Admitting: Internal Medicine

## 2013-12-23 NOTE — Telephone Encounter (Signed)
Pt has questions regarding Dr. Cruzita Lederer stating that the appts for 01/02/14 needs to be kept and she doesn't understand why Dr. Cruzita Lederer told the pt's daughter to cancel Dr. Esperanza Heir appt

## 2013-12-23 NOTE — Telephone Encounter (Signed)
Because thyroid cancer is usually managed by endocrinology, not oncology. They already established care with me about her Thyroid cancer management and I referred to surgery, with whom they already have an appt. I will be managing her after the surgery. They can see oncology as a second opinion if they like, but otherwise an appt is not needed.

## 2013-12-23 NOTE — Telephone Encounter (Signed)
Please read note below and advise.  

## 2014-01-02 ENCOUNTER — Ambulatory Visit: Payer: Self-pay | Admitting: Hematology and Oncology

## 2014-01-02 ENCOUNTER — Ambulatory Visit: Payer: Self-pay

## 2014-01-12 ENCOUNTER — Ambulatory Visit (INDEPENDENT_AMBULATORY_CARE_PROVIDER_SITE_OTHER): Payer: BC Managed Care – PPO | Admitting: Surgery

## 2014-01-12 ENCOUNTER — Encounter (INDEPENDENT_AMBULATORY_CARE_PROVIDER_SITE_OTHER): Payer: Self-pay | Admitting: Surgery

## 2014-01-12 VITALS — BP 122/78 | HR 83 | Temp 98.4°F | Ht 62.0 in | Wt 141.0 lb

## 2014-01-12 DIAGNOSIS — C73 Malignant neoplasm of thyroid gland: Secondary | ICD-10-CM

## 2014-01-12 NOTE — Patient Instructions (Signed)

## 2014-01-12 NOTE — Progress Notes (Signed)
General Surgery [facility]  Chief Complaint  Patient presents with  . Thyroid Cancer    papillary thyroid cancer diagnosed in [place] - referral from Dr. Philemon Kingdom    HISTORY: Patient is a 43 year old female from [place] recently diagnosed with papillary thyroid carcinoma. Patient had ultrasound and biopsy performed in [place]. She presents today on referral from her endocrinologist for evaluation for surgical resection. Patient has copies of her ultrasound report and her cytopathology report. We will scan these reports into the medical record. They clearly identify multiple thyroid nodules and show papillary thyroid carcinoma which is translated into [place].  Patient has no prior history of thyroid disease. She has had no prior head or neck surgery. There is a family history of goiter in the patient's mother. There is no family history of thyroid cancer. There is no history of other endocrine neoplasms.  Past Medical History  Diagnosis Date  . Hepatitis B   . Asthma   . Thyroid disease   . Allergy     SEASONAL   . Hemorrhoids     Current Outpatient Prescriptions  Medication Sig Dispense Refill  . albuterol (PROVENTIL HFA;VENTOLIN HFA) 108 (90 BASE) MCG/ACT inhaler Inhale 2 puffs into the lungs every 4 (four) hours as needed for wheezing.  6.7 g  2  . cetirizine (ZYRTEC) 10 MG tablet Take 1 tablet (10 mg total) by mouth daily.  30 tablet  1  . fluticasone (FLONASE) 50 MCG/ACT nasal spray Place 2 sprays into the nose daily.  16 g  2  . Fluticasone-Salmeterol (ADVAIR) 250-50 MCG/DOSE AEPB Inhale 1 puff into the lungs every 12 (twelve) hours.  60 each  2  . norgestimate-ethinyl estradiol (ORTHO-CYCLEN,SPRINTEC,PREVIFEM) 0.25-35 MG-MCG tablet Take 1 tablet by mouth daily.  1 Package  11  . oxymetazoline (AFRIN NASAL SPRAY) 0.05 % nasal spray Place 2 sprays into the nose 2 (two) times daily. Use for 3 days and then stop  30 mL  0  .  pseudoephedrine-acetaminophen (TYLENOL SINUS) 30-500 MG TABS Take 1 tablet by mouth every 6 (six) hours as needed.  60 tablet  0   No current facility-administered medications for this visit.    Allergies  Allergen Reactions  . Aspirin Other (See Comments)    Easily bruising    Family History  Problem Relation Age of Onset  . Hypertension Mother   . Kidney disease Father     History   Social History  . Marital Status: Single    Spouse Name: N/A    Number of Children: N/A  . Years of Education: N/A   Social History Main Topics  . Smoking status: Never Smoker   . Smokeless tobacco: Never Used  . Alcohol Use: No  . Drug Use: No  . Sexual Activity: Not Currently    Birth Control/ Protection: None   Other Topics Concern  . None   Social History Narrative  . None    REVIEW OF SYSTEMS - PERTINENT POSITIVES ONLY: Patient notes frequent congestion and sinus drainage. She notes frequent episodes of hoarseness. She denies tremor. She denies palpitation. She denies compressive symptoms.  EXAM: Filed Vitals:   01/12/14 1033  BP: 122/78  Pulse: 83  Temp: 98.4 F (36.9 C)    GENERAL: well-developed, well-nourished, no acute distress HEENT: normocephalic; pupils equal and reactive; sclerae clear; dentition good; mucous membranes moist NECK:  Palpable 1.5 cm nodule mid left thyroid lobe, mobile, nontender; right lobe without palpable abnormality; asymmetric on extension;  no palpable anterior or posterior cervical lymphadenopathy; no supraclavicular masses; no tenderness CHEST: clear to auscultation bilaterally without rales, rhonchi, or wheezes CARDIAC: regular rate and rhythm without significant murmur; peripheral pulses are full EXT:  non-tender without edema; no deformity NEURO: no gross focal deficits; no sign of tremor   LABORATORY RESULTS: See [facility] (CHL-Epic) for most recent results  RADIOLOGY RESULTS: See [facility] (CHL-Epic) for most recent  results  IMPRESSION: Papillary thyroid carcinoma, left thyroid lobe, possibly multifocal  PLAN: I discussed the above findings at length with the patient, her daughter who accompanies her, and the translator. I provided him with written literature to review at home.  We discussed the need for total thyroidectomy with limited lymph node dissection. We discussed the risk and benefits of the procedure including the possibility of recurrent laryngeal nerve injury and injury to parathyroid glands. We discussed the hospital stay to be anticipated. We discussed the postoperative recovery and need for adjuvant radioactive iodine treatment. They understand and wish to proceed in the near future.  Patient apparently has financial issues. We will ask our financial counselor to evaluate her case and assist.  The risks and benefits of the procedure have been discussed at length with the patient.  The patient understands the proposed procedure, potential alternative treatments, and the course of recovery to be expected.  All of the patient's questions have been answered at this time.  The patient wishes to proceed with surgery.   Earnstine Regal, MD, [facility]  Primary Care Physician: Angelica Chessman, MD

## 2014-01-20 ENCOUNTER — Ambulatory Visit (INDEPENDENT_AMBULATORY_CARE_PROVIDER_SITE_OTHER): Payer: BC Managed Care – PPO | Admitting: Surgery

## 2014-01-26 ENCOUNTER — Ambulatory Visit (INDEPENDENT_AMBULATORY_CARE_PROVIDER_SITE_OTHER): Payer: BC Managed Care – PPO | Admitting: Family Medicine

## 2014-01-26 VITALS — BP 120/88 | HR 80 | Temp 98.1°F | Resp 16 | Ht 61.0 in | Wt 137.4 lb

## 2014-01-26 DIAGNOSIS — Z0000 Encounter for general adult medical examination without abnormal findings: Secondary | ICD-10-CM

## 2014-01-26 DIAGNOSIS — R209 Unspecified disturbances of skin sensation: Secondary | ICD-10-CM

## 2014-01-26 DIAGNOSIS — R5381 Other malaise: Secondary | ICD-10-CM

## 2014-01-26 DIAGNOSIS — K644 Residual hemorrhoidal skin tags: Secondary | ICD-10-CM

## 2014-01-26 DIAGNOSIS — M25569 Pain in unspecified knee: Secondary | ICD-10-CM

## 2014-01-26 DIAGNOSIS — R202 Paresthesia of skin: Secondary | ICD-10-CM

## 2014-01-26 DIAGNOSIS — K648 Other hemorrhoids: Secondary | ICD-10-CM

## 2014-01-26 DIAGNOSIS — K219 Gastro-esophageal reflux disease without esophagitis: Secondary | ICD-10-CM

## 2014-01-26 DIAGNOSIS — M25562 Pain in left knee: Secondary | ICD-10-CM

## 2014-01-26 DIAGNOSIS — R5383 Other fatigue: Secondary | ICD-10-CM

## 2014-01-26 DIAGNOSIS — D259 Leiomyoma of uterus, unspecified: Secondary | ICD-10-CM

## 2014-01-26 DIAGNOSIS — R200 Anesthesia of skin: Secondary | ICD-10-CM

## 2014-01-26 DIAGNOSIS — G8929 Other chronic pain: Secondary | ICD-10-CM

## 2014-01-26 DIAGNOSIS — C73 Malignant neoplasm of thyroid gland: Secondary | ICD-10-CM

## 2014-01-26 LAB — POCT URINALYSIS DIPSTICK
Bilirubin, UA: NEGATIVE
Blood, UA: NEGATIVE
Glucose, UA: NEGATIVE
Ketones, UA: 15
LEUKOCYTES UA: NEGATIVE
NITRITE UA: NEGATIVE
PH UA: 7
PROTEIN UA: NEGATIVE
Spec Grav, UA: 1.01
UROBILINOGEN UA: 0.2

## 2014-01-26 LAB — POCT UA - MICROSCOPIC ONLY
CRYSTALS, UR, HPF, POC: NEGATIVE
Casts, Ur, LPF, POC: NEGATIVE
Mucus, UA: NEGATIVE
YEAST UA: NEGATIVE

## 2014-01-26 LAB — POCT SEDIMENTATION RATE: POCT SED RATE: 31 mm/hr — AB (ref 0–22)

## 2014-01-26 MED ORDER — RANITIDINE HCL 150 MG PO TABS: 150.0000 mg | ORAL_TABLET | Freq: Two times a day (BID) | ORAL | Status: DC

## 2014-01-26 MED ORDER — HYDROCORTISONE ACETATE 25 MG RE SUPP: 25.0000 mg | Freq: Two times a day (BID) | RECTAL | Status: DC

## 2014-01-26 NOTE — Progress Notes (Signed)
Subjective:  This chart was scribed for Delman Cheadle, MD by Ladene Artist, ED Scribe. The patient was seen in room 13. Patient's care was started at 4:26 PM.   Patient ID: Michelle Hammond, female    DOB: [birth year], 43 y.o.   MRN: [id number]  Chief Complaint  Patient presents with  . Annual Exam    Had a pap done in [place] about 2 months ago, wants to have thyroid levels checked  . Numbness    on right side of body  . Knee Pain    left knee hurts occasionally   . Fatigue  . Hemorrhoids    noticed a small amount of blood in stool   HPI HPI Comments: Michelle Hammond is a 43 y.o. female, with a h/o thyroid cancer, who presents to the [facility] for CPE. Pt was diagnosed with papillary thyroid carcinoma by [place] in [place]. Pt is followed up by Cruzita Lederer, endocrinologist. Pt referred for thyroidectomy which is scheduled for the end of this month. Pt states that she is ready for her surgery. CMP in 2013, negative. H.pylori antibody, negative. Lipids were normal in 2012. A1C was 5.0. Pt had thyroid function checked 6 weeks ago that was normal. Pt had pap in 06/30/11 and 10/18/12 that were both normal. Pt has h/o uterine fibroids and ovarian cysts. [place] 04/2013 also showed cysts. Pt states that her periods are regular. Last mammogram was 10/2012. Last tetanus was 2007.   Pt reports fatigue with associated numbness on R side of her body from her head down, intermittent L knee pain, L knee swelling, fatigue and minimal blood in stool with hemorrhoids. She also reports fever, diaphoresis, chills, chest tightness, SOB, leg swelling, visual disturbances, facial swelling, constipation, diarrhea, flatulence, acid reflux. She has not tried any medication to relieve hemorrhoids. Pt takes Aleve daily for knee pain with mild relief.   Pt is a vegetarian. She states that her diet consists heavily of vegetables and rice.   Pt takes Vitamin D, multivitamin and "vitamins for bones".   Past Medical  History  Diagnosis Date  . Hepatitis B   . Asthma   . Thyroid disease   . Allergy     SEASONAL   . Hemorrhoids   . Cancer     Thyroid cancer    Current Outpatient Prescriptions on File Prior to Visit  Medication Sig Dispense Refill  . cetirizine (ZYRTEC) 10 MG tablet Take 1 tablet (10 mg total) by mouth daily.  30 tablet  1  . pseudoephedrine-acetaminophen (TYLENOL SINUS) 30-500 MG TABS Take 1 tablet by mouth every 6 (six) hours as needed.  60 tablet  0  . albuterol (PROVENTIL HFA;VENTOLIN HFA) 108 (90 BASE) MCG/ACT inhaler Inhale 2 puffs into the lungs every 4 (four) hours as needed for wheezing.  6.7 g  2  . fluticasone (FLONASE) 50 MCG/ACT nasal spray Place 2 sprays into the nose daily.  16 g  2  . Fluticasone-Salmeterol (ADVAIR) 250-50 MCG/DOSE AEPB Inhale 1 puff into the lungs every 12 (twelve) hours.  60 each  2  . norgestimate-ethinyl estradiol (ORTHO-CYCLEN,SPRINTEC,PREVIFEM) 0.25-35 MG-MCG tablet Take 1 tablet by mouth daily.  1 Package  11  . oxymetazoline (AFRIN NASAL SPRAY) 0.05 % nasal spray Place 2 sprays into the nose 2 (two) times daily. Use for 3 days and then stop  30 mL  0   No current facility-administered medications on file prior to visit.   Allergies  Allergen Reactions  .  Aspirin Other (See Comments)    Easily bruising   Review of Systems  Constitutional: Positive for chills, diaphoresis and fatigue.  HENT: Positive for congestion and facial swelling.   Eyes: Positive for visual disturbance.  Respiratory: Positive for chest tightness and shortness of breath.   Cardiovascular: Positive for leg swelling.  Gastrointestinal: Positive for diarrhea, constipation (intermittent), blood in stool (intermittent) and rectal pain.  Endocrine: Positive for cold intolerance.  Musculoskeletal: Positive for arthralgias, joint swelling, myalgias and neck pain.  Neurological: Positive for dizziness, numbness and headaches.  Hematological: Bruises/bleeds easily.    Psychiatric/Behavioral: Positive for dysphoric mood.   Triage Vitals: BP 120/88  Pulse 80  Temp(Src) 98.1 F (36.7 C) (Oral)  Resp 16  Ht 5\' 1"  (1.549 m)  Wt 137 lb 6.4 oz (62.324 kg)  BMI 25.97 kg/m2  SpO2 100%  LMP 01/06/2014     Objective:   Physical Exam  Nursing note and vitals reviewed. Constitutional: She is oriented to person, place, and time. She appears well-developed and well-nourished.  HENT:  Head: Normocephalic and atraumatic.  Right Ear: Tympanic membrane is injected and retracted.  Left Ear: Tympanic membrane is injected and retracted.  Nose: Mucosal edema and rhinorrhea present.  Mouth/Throat: Oropharynx is clear and moist.  Eyes: Conjunctivae and EOM are normal.  Neck: Neck supple.  Cardiovascular: Regular rhythm, S1 normal, S2 normal and normal heart sounds.  Tachycardia present.  Exam reveals no gallop and no friction rub.   No murmur heard. Pulses:      Dorsalis pedis pulses are 2+ on the right side, and 2+ on the left side.  Pulmonary/Chest: Effort normal and breath sounds normal.  Abdominal: Soft. Bowel sounds are normal. She exhibits no distension and no mass. There is no hepatosplenomegaly.  Palpable  Genitourinary: Rectal exam shows external hemorrhoid and internal hemorrhoid.  Rectum: 1 non-inflamed external hermorroid at 4 o'clock Internal hemorrhoid 2 o'clock, non-inflamed, no abrupt active bleeding  Musculoskeletal: Normal range of motion. She exhibits no edema.  No lower extremity edema  Lymphadenopathy:       Head (right side): No submandibular adenopathy present.       Head (left side): No submandibular adenopathy present.       Right cervical: No superficial cervical and no posterior cervical adenopathy present.      Left cervical: No superficial cervical and no posterior cervical adenopathy present.       Right: No supraclavicular adenopathy present.       Left: No supraclavicular adenopathy present.  Nodular with increased anterior  cervical adenopathy  Neurological: She is alert and oriented to person, place, and time.  Reflex Scores:      Tricep reflexes are 2+ on the right side and 2+ on the left side.      Bicep reflexes are 2+ on the right side and 2+ on the left side.      Brachioradialis reflexes are 2+ on the right side and 2+ on the left side.      Patellar reflexes are 2+ on the right side and 2+ on the left side. Skin: Skin is warm and dry.  Psychiatric: She has a normal mood and affect. Her behavior is normal.   EKG normal. No ischemic changes.    Assessment & Plan:   Routine general medical examination at a health care facility - Plan: POCT UA - Microscopic Only, POCT urinalysis dipstick, CBC with Differential, Comprehensive metabolic panel, C-reactive protein, Lipid panel, Vit D  25 hydroxy (rtn osteoporosis  monitoring), EKG 12-Lead  Internal hemorrhoids  External hemorrhoid  Thyroid cancer  Knee pain, chronic, left  Gastroesophageal reflux disease, esophagitis presence not specified - Plan: H. pylori antibody, IgG, Tissue Transglutaminase, IGG, Lipase  Uterine leiomyoma, unspecified location  Other malaise and fatigue - Plan: POCT SEDIMENTATION RATE, POCT glucose (manual entry)  Numbness and tingling sensation of skin - Plan: Vitamin B12, POCT SEDIMENTATION RATE, POCT glucose (manual entry)  Meds ordered this encounter  Medications  . ranitidine (ZANTAC) 150 MG tablet    Sig: Take 1 tablet (150 mg total) by mouth 2 (two) times daily.    Dispense:  60 tablet    Refill:  1  . hydrocortisone (ANUSOL-HC) 25 MG suppository    Sig: Place 1 suppository (25 mg total) rectally 2 (two) times daily.    Dispense:  12 suppository    Refill:  0    I personally performed the services described in this documentation, which was scribed in my presence. The recorded information has been reviewed and considered, and addended by me as needed.  Delman Cheadle, MD MPH

## 2014-01-26 NOTE — Patient Instructions (Addendum)
Start taking glucosamine - chrondroiton for your knees.  Stop taking your aleve. We do not want this in your system before your surgery. Come see [place] after your surgery so we can start you on something more effective and consider whether you need xray or even try injection.  After your thyroidectomy, come back to see me so we can re-evaluate all of your symptoms.  Especially the numbness and fatigue.  For your hemorrhoids, you need to have large soft bowel movements daily - not diarrhea but not strain with constipation. Start to bulk up your stools with a fiber supplement such as benefiber or miralax daily.     B?nh Tr? (Hemorrhoids) Tr? l cc t?nh m?ch b? s?ng xung quanh tr?c trng ho?c h?u mn. C hai lo?i tr?:  Tr? n?i. Lo?i ny xu?t hi?n trong cc t?nh m?ch ngay bn trong tr?c trng. Chng c th? ch?c xuyn qua bn ngoi v b? kch thch v ?au.  Tr? ngo?i. Lo?i ny xu?t hi?n ? cc t?nh m?ch bn ngoi h?u mn v c th? c?m th?y s?ng ?au ho?c m?t c?c s?ng c?ng g?n h?u mn. [place].  Bo ph.  To bn ho?c tiu ch?y.  R?n ?? ??i ti?n.  Ng?i lu trong nh v? sinh.  Nng v?t n?ng ho?c ho?t ??ng khc khi?n b?n c?ng th?ng.  Giao h?p qua ???ng h?u mn. TRI?U CH?NG  ?au.  Ng?a ho?c kch thch h?u mn.  Ch?y mu tr?c trng.  R r? phn.  S?ng h?u mn.  M?t ho?c nhi?u c?c xung quanh h?u mn. CH?N ?ON Chuyn gia ch?m [place] s?c kh?e c th? ch?n ?on b?nh tr? b?ng cch khm b?ng m?t th??ng. Cc ki?m tra v xt nghi?m khc c th? ???c th?c hi?n bao g?m:  Ki?m tra khu v?c tr?c trng b?ng tay ?eo g?ng (ki?m tra tr?c trng b?ng ngn tay).  Ki?m tra ?ng h?u mn b?ng cch s? d?ng m?t ?ng nh? (?ng soi).  Xt nghi?m mu n?u b?n b? m?t m?t l??ng mu ?ng k?.  M?t ki?m tra ?? xem bn trong ??i trng (soi ??i trng sigma ho??c soi ??i trng). ?I?U TR? H?u h?t b?nh tr? c th? ???c ?i?u tr? t?i nh. Tuy nhin, n?u cc tri?u ch?ng c v? khng kh h?n ho?c n?u b?n b? ch?y  mu tr?c trng nhi?u, chuyn gia ch?m [place] s?c kh?e c th? th?c hi?n m?t th? thu?t ?? gip lm cho tr? nh? h?n ho?c lo?i b? chng hon ton. Cc ph??ng php ?i?u tr? c th? bao g?m:  ??t m?t b?ng cao su t?i chn tr? ?? c?t ??t tu?n hon mu (th?t ??ng m?ch b?ng b?ng cao su).  Tim ha ch?t ?? thu nh? tr? (li?u php x? ho).  S? d?ng m?t d?ng c? ?? ??t tr? (li?u php nh sng h?ng ngo?i).  Ph?u thu?t lo?i b? tr? (th? thu?t c?t tr?).  K?p tr? ?? ch?n dng mu ??n m (k?p tr?). H??NG D?N CH?M [place] T?I NH  ?n th?c ?n c nhi?u ch?t x? nh? ng? c?c, ??u, cc lo?i h?t, tri cy v rau. Hy h?i bc s? v? vi?c dng cc s?n ph?m c b? sung ch?t x? (b? sung ch?t x?).  U?ng nhi?u n??c h?n. U?ng ?? n??c v dung d?ch ?? n??c ti?u trong ho?c c mu vng nh?t.  T?p th? d?c th??ng xuyn.  ?i v? sinh khi b?n c nhu c?u. Khng ch?.  Trnh r?n khi ?i ??i ti?n.  Gi? vng  h?u mn kh v s?ch s?. S? d?ng gi?y v? sinh ??t ho?c kh?n gi?y ?m sau khi ??i ti?n.  Thu?c kem v thu?c ??n c th? ???c s? d?ng ho?c p d?ng theo ch? d?n.  Ch? s? d?ng thu?c khng c?n k toa ho?c thu?c c?n k toa theo ch? d?n c?a chuyn gia ch?m [place] s?c kh?e.  T?m ng?i trong n??c ?m kho?ng 15-20 pht, 3-4 l?n m?t ngy ?? gi?m ?au v kh ch?u.  Ch??m ? l?nh vo ch? tr? n?u n nh?y c?m ?au v s?ng ln. Vi?c s? d?ng cc gi ch??m ? gi?a cc l?n t?m ng?i c th? h?u ch.  Cho ? l?nh vo ti nh?a.  ??t kh?n t?m gi?a da v ti.  ?? ? l?nh kho?ng 15-20 pht, 3-4 l?n m?t ngy.  Khng s? d?ng g?i hnh bnh rn ho?c ng?i lu trn b?n c?u. ?i?u ny lm t?ng tch t? mu v ?au. HY ?I KHM N?U:  B?n b? ?au v s?ng t?ng ln khng ki?m sot ???c b?ng ?i?u tr? ho?c thu?c.  B?n b? ch?y mu khng ki?m sot ???c.  B?n g?p kh kh?n ho?c khng th? ?i ??i ti?n.  B?n b? ?au ho?c vim nhi?m bn ngoi khu v?c tr?Marland Kitchen ??M B?O B?N:  Hi?u cc h??ng d?n ny.  S? theo di tnh tr?ng c?a mnh.  S? yu c?u tr? gip ngay l?p t?c n?u b?n c?m th?y  khng ?? ho?c tnh tr?ng tr?m tr?ng h?n. Document Released: 04/05/2005 Document Revised: 02/26/2013 [facility] Patient Information 2015 [place]. This information is not intended to replace advice given to you by your health care provider. Make sure you discuss any questions you have with your health care provider.  Food Choices for Gastroesophageal Reflux Disease When you have gastroesophageal reflux disease (GERD), the foods you eat and your eating habits are very important. Choosing the right foods can help ease the discomfort of GERD. WHAT GENERAL GUIDELINES DO I NEED TO FOLLOW?  Choose fruits, vegetables, whole grains, low-fat dairy products, and low-fat meat, fish, and poultry.  Limit fats such as oils, salad dressings, butter, nuts, and avocado.  Keep a food diary to identify foods that cause symptoms.  Avoid foods that cause reflux. These may be different for different people.  Eat frequent small meals instead of three large meals each day.  Eat your meals slowly, in a relaxed setting.  Limit fried foods.  Cook foods using methods other than frying.  Avoid drinking alcohol.  Avoid drinking large amounts of liquids with your meals.  Avoid bending over or lying down until 2-3 hours after eating. WHAT FOODS ARE NOT RECOMMENDED? The following are some foods and drinks that may worsen your symptoms: Vegetables Tomatoes. Tomato juice. Tomato and spaghetti sauce. Chili peppers. Onion and garlic. Horseradish. Fruits Oranges, grapefruit, and lemon (fruit and juice). Meats High-fat meats, fish, and poultry. This includes hot dogs, ribs, ham, sausage, salami, and bacon. Dairy Whole milk and chocolate milk. Sour cream. Cream. Butter. Ice cream. Cream cheese.  Beverages Coffee and tea, with or without caffeine. Carbonated beverages or energy drinks. Condiments Hot sauce. Barbecue sauce.  Sweets/Desserts Chocolate and cocoa. Donuts. Peppermint and spearmint. Fats and  Oils High-fat foods, including [place] fries and potato chips. Other Vinegar. Strong spices, such as black pepper, white pepper, red pepper, cayenne, curry powder, cloves, ginger, and chili powder. The items listed above may not be a complete list of foods and beverages to avoid. Contact your dietitian for more information. Document Released:  06/26/2005 Document Revised: 07/01/2013 Document Reviewed: 04/30/2013 [facility] Patient Information 2015 [place], [place]. This information is not intended to replace advice given to you by your health care provider. Make sure you discuss any questions you have with your health care provider.  B?nh Tro Ng??c D? Dy Th?c Qu?n, Ng??i L?n (Gastroesophageal Reflux Diseaes, Adult) B?nh tro ng??c d? dy th?c qu?n (GERD) x?y ra khi axit t? d? dy tro ln th?c qu?n. Khi axit ti?p xc v?i th?c qu?n, axit gy ra ?au (vim) trong th?c qu?n. Theo th?i gian, GERD c th? t?o ra cc l? nh? (cc v?t lot) ? nim m?c th?c qu?n.  NGUYN NHN  Tr?ng l??ng c? th? t?ng. ?i?u ny t?o p l?c ln d? dy, lm t?ng axit t? d? dy vo th?c qu?n.  Ht thu?c l. Ht thu?c l lm t?ng s?n sinh axit trong d? dy.  U?ng r??u. ?y l nguyn nhn lm gi?m p l?c trong c? th?t th?c qu?n d??i (van ho?c vng c? gi?a th?c qu?n v d? dy), cho php axit t? d? dy vo th?c qu?n.  ?n t?i mu?n v b?ng no. Tnh tr?ng ny lm t?ng p l?c c?ng nh? t?ng s?n sinh axit trong d? dy.  D? t?t c? th?t th?c qu?n d??i. ?i khi khng tm th?y nguyn nhn. TRI?U CH?NG  ?au rt ? ph?n d??i gi?a ng?c pha sau x??ng ?c v ? khu v?c gi?a d? dy. Hi?n t??ng ny c th? x?y ra hai l?n m?t tu?n ho?c th??ng xuyn h?n.  Kh nu?t.  ?au h?ng.  Ho khan.  Cc tri?u ch?ng gi?ng hen suy?n, bao g?m t?c ng?c,kh th? ho?c th? kh kh. CH?N ?ON Chuyn gia ch?m [place] s?c kh?e c th? ch?n ?on GERD d?a trn cc tri?u ch?ng c?a b?n. Trong m?t s? tr??ng h?p, ch?p X quang v cc xt nghi?m khc c th? ???c ti?n hnh ??  ki?m tra cc bi?n ch?ng ho?c tnh tr?ng c?a d? dy v th?c qu?n. ?I?U TR? Chuyn gia ch?m [place] s?c kh?e c th? khuy?n ngh? dng thu?c khng c?n k ??n ho?c thu?c c?n k ??n ?? gip gi?m s?n sinh axit. Hy h?i chuyn gia ch?m [place] s?c kh?e c?a b?n tr??c khi b?t ??u ho?c dng thm b?t k? lo?i thu?c m?i no. H??NG D?N CH?M [place] T?I NH  Thay ??i cc y?u t? m b?n c th? ki?m sot ???c. H?i chuyn gia ch?m [place] s?c kh?e ?? ???c h??ng d?n v? vi?c gi?m cn, b? thu?c l v s? d?ng r??u.  Hessie Diener cc lo?i th?c ph?m v ?? u?ng lm cho cc tri?u ch?ng t?i t? h?n, ch?ng h?n nh?:  ?? u?ng c caffeine ho?c r??u.  S c la.  B?c h ho?c v? b?c h.  T?i v hnh ty.  Th?c ?n [place].  Tri cy h? cam, ch?ng h?n nh? cam, chanh hay chanh ty.  Cc th?c ?n c c chua, ch?ng h?n nh? n??c x?t, ?t, salsa (n??c x?t [place]) v bnh pizza.  Cc lo?i th?c ?n chin xo v nhi?u ch?t bo.  Trnh n?m xu?ng ng? 3 ti?ng tr??c gi? ?i ng? ho?c tr??c khi c m?t gi?c ng? ng?n.  ?n nh?ng b?a ?n nh?, th??ng xuyn h?n thay v cc b?a ?n l?n.  M?c qu?n o r?ng. Khng ?eo b?t c? th? g ch?t quanh th?t l?ng gy p l?c ln d? dy.  Nng ??u gi??ng cao ln t? 6 ??n 8 inch b?ng cc kh?i g? ?? gip b?n ng?. S? d?ng thm  g?i s? khng c tc d?ng.  Ch? s? d?ng thu?c khng c?n k ??n ho?c thu?c c?n k ??n ?? gi?m ?au, gi?m c?m gic kh ch?u ho?c h? s?t theo ch? d?n c?a chuyn gia ch?m [place] s?c kh?e c?a b?n.  Khng dng thu?c atpirin, ibuprofen ho?c cc thu?c ch?ng vim khng c steroid (NSAID) khc. HY NGAY L?P T?C ?I KHM N?U:  B?n b? ?au ? cnh tay, c?, hm, r?ng ho?c l?ng.  Hi?n t??ng ?au t?ng ln ho?c thay ??i theo c??ng ?? ho?c th?i gian.  B?n b? bu?n nn, nn ho?c ?? m? hi (tot m? hi).  B?n b? kh th? ho?c ng?t x?u.  Ch?t nn c mu xanh l cy, vng, ?en ho?c trng gi?ng nh? b c ph ho?c mu.  Phn c mu ??, ?? nh? mu ho?c ?en. Nh?ng tri?u ch?ng ny c th? l d?u hi?u c?a cc v?n ?? khc, ch?ng h?n nh? b?nh tim,  ch?y mu d? dy ho?c ch?y mu th?c qu?n. ??M B?O B?N:  Hi?u cc h??ng d?n ny.  S? theo di tnh tr?ng c?a mnh.  S? yu c?u tr? gip ngay l?p t?c n?u b?n c?m th?y khng ?? ho?c tnh tr?ng tr?m tr?ng h?n. Document Released: 04/05/2005 Document Revised: 02/26/2013 [facility] Patient Information 2015 [place]. This information is not intended to replace advice given to you by your health care provider. Make sure you discuss any questions you have with your health care provider. Keeping You Healthy  Get These Tests 1. Blood Pressure- Have your blood pressure checked once a year by your health care provider.  Normal blood pressure is 120/80. 2. Weight- Have your body mass index (BMI) calculated to screen for obesity.  BMI is measure of body fat based on height and weight.  You can also calculate your own BMI at GravelBags.it. 3. Cholesterol- Have your cholesterol checked every 5 years starting at age 69 then yearly starting at age 11. 79. Chlamydia, HIV, and other sexually transmitted diseases- Get screened every year until age 29, then within three months of each new sexual provider. 5. Pap Smear- Every 1-3 years; discuss with your health care provider. 6. Mammogram- Every year starting at age 75  Take these medicines  Calcium with Vitamin D-Your body needs 1200 mg of Calcium each day and [phone] IU of Vitamin D daily.  Your body can only absorb 500 mg of Calcium at a time so Calcium must be taken in 2 or 3 divided doses throughout the day.  Multivitamin with folic acid- Once daily if it is possible for you to become pregnant.  Get these Immunizations  Gardasil-Series of three doses; prevents HPV related illness such as genital warts and cervical cancer.  Menactra-Single dose; prevents meningitis.  Tetanus shot- Every 10 years.  Flu shot-Every year.  Take these steps 1. Do not smoke-Your healthcare provider can help you quit.  For tips on how to quit go to  www.smokefree.gov or call 1-800 QUITNOW. 2. Be physically active- Exercise 5 days a week for at least 30 minutes.  If you are not already physically active, start slow and gradually work up to 30 minutes of moderate physical activity.  Examples of moderate activity include walking briskly, dancing, swimming, bicycling, etc. 3. Breast Cancer- A self breast exam every month is important for early detection of breast cancer.  For more information and instruction on self breast exams, ask your healthcare provider or https://www.patel.info/. 4. Eat a healthy diet- Eat a variety of healthy foods  such as fruits, vegetables, whole grains, low fat milk, low fat cheeses, yogurt, lean meats, poultry and fish, beans, nuts, tofu, etc.  For more information go to www. Thenutritionsource.org 5. Drink alcohol in moderation- Limit alcohol intake to one drink or less per day. Never drink and drive. 6. Depression- Your emotional health is as important as your physical health.  If you're feeling down or losing interest in things you normally enjoy please talk to your healthcare provider about being screened for depression. 7. Dental visit- Brush and floss your teeth twice daily; visit your dentist twice a year. 8. Eye doctor- Get an eye exam at least every 2 years. 9. Helmet use- Always wear a helmet when riding a bicycle, motorcycle, rollerblading or skateboarding. 32. Safe sex- If you may be exposed to sexually transmitted infections, use a condom. 11. Seat belts- Seat belts can save your live; always wear one. 12. Smoke/Carbon Monoxide detectors- These detectors need to be installed on the appropriate level of your home. Replace batteries at least once a year. 13. Skin cancer- When out in the sun please cover up and use sunscreen 15 SPF or higher. 14. Violence- If anyone is threatening or hurting you, please tell your healthcare provider.

## 2014-01-27 LAB — CBC WITH DIFFERENTIAL/PLATELET
BASOS ABS: 0 10*3/uL (ref 0.0–0.1)
BASOS PCT: 0 % (ref 0–1)
Eosinophils Absolute: 0 10*3/uL (ref 0.0–0.7)
Eosinophils Relative: 0 % (ref 0–5)
HCT: 40.3 % (ref 36.0–46.0)
Hemoglobin: 14.3 g/dL (ref 12.0–15.0)
Lymphocytes Relative: 29 % (ref 12–46)
Lymphs Abs: 2.6 10*3/uL (ref 0.7–4.0)
MCH: 29.5 pg (ref 26.0–34.0)
MCHC: 35.5 g/dL (ref 30.0–36.0)
MCV: 83.3 fL (ref 78.0–100.0)
Monocytes Absolute: 0.4 10*3/uL (ref 0.1–1.0)
Monocytes Relative: 4 % (ref 3–12)
NEUTROS ABS: 6 10*3/uL (ref 1.7–7.7)
NEUTROS PCT: 67 % (ref 43–77)
PLATELETS: 336 10*3/uL (ref 150–400)
RBC: 4.84 MIL/uL (ref 3.87–5.11)
RDW: 13.2 % (ref 11.5–15.5)
WBC: 9 10*3/uL (ref 4.0–10.5)

## 2014-01-27 LAB — COMPREHENSIVE METABOLIC PANEL
ALK PHOS: 52 U/L (ref 39–117)
ALT: 16 U/L (ref 0–35)
AST: 18 U/L (ref 0–37)
Albumin: 4.7 g/dL (ref 3.5–5.2)
BUN: 10 mg/dL (ref 6–23)
CO2: 26 mEq/L (ref 19–32)
Calcium: 9.4 mg/dL (ref 8.4–10.5)
Chloride: 100 mEq/L (ref 96–112)
Creat: 0.5 mg/dL (ref 0.50–1.10)
Glucose, Bld: 85 mg/dL (ref 70–99)
POTASSIUM: 3.8 meq/L (ref 3.5–5.3)
Sodium: 137 mEq/L (ref 135–145)
Total Bilirubin: 0.6 mg/dL (ref 0.2–1.2)
Total Protein: 7.7 g/dL (ref 6.0–8.3)

## 2014-01-27 LAB — LIPID PANEL
Cholesterol: 171 mg/dL (ref 0–200)
HDL: 57 mg/dL (ref >39)
LDL Cholesterol: 85 mg/dL (ref 0–99)
TRIGLYCERIDES: 146 mg/dL (ref <150)
Total CHOL/HDL Ratio: 3 Ratio
VLDL: 29 mg/dL (ref 0–40)

## 2014-01-27 LAB — LIPASE: Lipase: 15 U/L (ref 0–75)

## 2014-01-27 LAB — C-REACTIVE PROTEIN: CRP: 0.7 mg/dL — ABNORMAL HIGH (ref <0.60)

## 2014-01-27 LAB — H. PYLORI ANTIBODY, IGG: H Pylori IgG: 0.4 {ISR}

## 2014-01-27 LAB — VITAMIN B12: Vitamin B-12: 580 pg/mL (ref 211–911)

## 2014-01-28 ENCOUNTER — Encounter (HOSPITAL_COMMUNITY): Payer: Self-pay | Admitting: Pharmacy Technician

## 2014-01-28 LAB — TISSUE TRANSGLUTAMINASE, IGG: Tissue Transglut Ab: 12.4 U/mL (ref <20)

## 2014-01-28 LAB — VITAMIN D 25 HYDROXY (VIT D DEFICIENCY, FRACTURES): Vit D, 25-Hydroxy: 49 ng/mL (ref 30–89)

## 2014-01-30 ENCOUNTER — Encounter (INDEPENDENT_AMBULATORY_CARE_PROVIDER_SITE_OTHER): Payer: Self-pay

## 2014-02-02 ENCOUNTER — Ambulatory Visit (HOSPITAL_COMMUNITY)
Admission: RE | Admit: 2014-02-02 | Discharge: 2014-02-02 | Disposition: A | Discharge status: Home / Self Care | Payer: BC Managed Care – PPO | Source: Ambulatory Visit | Attending: Anesthesiology | Admitting: Anesthesiology

## 2014-02-02 ENCOUNTER — Encounter (HOSPITAL_COMMUNITY)
Admission: RE | Admit: 2014-02-02 | Discharge: 2014-02-02 | Disposition: A | Discharge status: Home / Self Care | Payer: BC Managed Care – PPO | Source: Ambulatory Visit | Attending: Surgery | Admitting: Surgery

## 2014-02-02 ENCOUNTER — Encounter (HOSPITAL_COMMUNITY): Payer: Self-pay

## 2014-02-02 DIAGNOSIS — B1910 Unspecified viral hepatitis B without hepatic coma: Secondary | ICD-10-CM | POA: Insufficient documentation

## 2014-02-02 DIAGNOSIS — R0989 Other specified symptoms and signs involving the circulatory and respiratory systems: Secondary | ICD-10-CM | POA: Insufficient documentation

## 2014-02-02 DIAGNOSIS — Z01812 Encounter for preprocedural laboratory examination: Secondary | ICD-10-CM | POA: Insufficient documentation

## 2014-02-02 DIAGNOSIS — R0789 Other chest pain: Secondary | ICD-10-CM | POA: Insufficient documentation

## 2014-02-02 DIAGNOSIS — J45909 Unspecified asthma, uncomplicated: Secondary | ICD-10-CM | POA: Insufficient documentation

## 2014-02-02 DIAGNOSIS — C73 Malignant neoplasm of thyroid gland: Secondary | ICD-10-CM | POA: Insufficient documentation

## 2014-02-02 DIAGNOSIS — Z01818 Encounter for other preprocedural examination: Secondary | ICD-10-CM | POA: Insufficient documentation

## 2014-02-02 HISTORY — DX: Shortness of breath: R06.02

## 2014-02-02 HISTORY — DX: Anesthesia of skin: R20.0

## 2014-02-02 HISTORY — DX: Gastro-esophageal reflux disease without esophagitis: K21.9

## 2014-02-02 HISTORY — DX: Other skin changes: R23.8

## 2014-02-02 HISTORY — DX: Spontaneous ecchymoses: R23.3

## 2014-02-02 LAB — APTT: aPTT: 36 s (ref 24–37)

## 2014-02-02 LAB — PROTIME-INR
INR: 0.95 (ref 0.00–1.49)
Prothrombin Time: 12.7 seconds (ref 11.6–15.2)

## 2014-02-02 LAB — HCG, SERUM, QUALITATIVE: PREG SERUM: NEGATIVE

## 2014-02-02 NOTE — Patient Instructions (Signed)
YOUR SURGERY IS SCHEDULED AT [facility]  ON:  Thursday  7/30  REPORT TO  SHORT STAY CENTER AT:  8:00 AM   PLEASE COME IN THE [place] ENTRANCE AND FOLLOW SIGNS TO SHORT STAY CENTER.  DO NOT EAT OR DRINK ANYTHING AFTER MIDNIGHT THE NIGHT BEFORE YOUR SURGERY.  YOU MAY BRUSH YOUR TEETH, RINSE OUT YOUR MOUTH--BUT NO WATER, NO FOOD, NO CHEWING GUM, NO MINTS, NO CANDIES, NO CHEWING TOBACCO.  PLEASE TAKE THE FOLLOWING MEDICATIONS THE AM OF YOUR SURGERY WITH A FEW SIPS OF WATER:  RANITIDINE  I DO NOT BRING VALUABLES, MONEY, CREDIT CARDS.  DO NOT WEAR JEWELRY, MAKE-UP, NAIL POLISH AND NO METAL PINS OR CLIPS IN YOUR HAIR. CONTACT LENS, DENTURES / PARTIALS, GLASSES SHOULD NOT BE WORN TO SURGERY AND IN MOST CASES-HEARING AIDS WILL NEED TO BE REMOVED.  BRING YOUR GLASSES CASE, ANY EQUIPMENT NEEDED FOR YOUR CONTACT LENS. FOR PATIENTS ADMITTED TO THE HOSPITAL--CHECK OUT TIME THE DAY OF DISCHARGE IS 11:00 AM.  ALL INPATIENT ROOMS ARE PRIVATE - WITH BATHROOM, TELEPHONE, TELEVISION AND WIFI INTERNET.  [facility] - Preparing for Surgery Before surgery, you can play an important role.  Because skin is not sterile, your skin needs to be as free of germs as possible.  You can reduce the number of germs on your skin by washing with CHG (chlorahexidine gluconate) soap before surgery.  CHG is an antiseptic cleaner which kills germs and bonds with the skin to continue killing germs even after washing. Please DO NOT use if you have an allergy to CHG or antibacterial soaps.  If your skin becomes reddened/irritated stop using the CHG and inform your nurse when you arrive at Short Stay. Do not shave (including legs and underarms) for at least 48 hours prior to the first CHG shower.  You may shave your face/neck. Please follow these instructions carefully:  1.  Shower with CHG Soap the night before surgery and the  morning of Surgery.  2.  If you choose to wash your hair, wash your hair first  as usual with your  normal  shampoo.  3.  After you shampoo, rinse your hair and body thoroughly to remove the  shampoo.                           4.  Use CHG as you would any other liquid soap.  You can apply chg directly  to the skin and wash                       Gently with a scrungie or clean washcloth.  5.  Apply the CHG Soap to your body ONLY FROM THE NECK DOWN.   Do not use on face/ open                           Wound or open sores. Avoid contact with eyes, ears mouth and genitals (private parts).                       Wash face,  Genitals (private parts) with your normal soap.             6.  Wash thoroughly, paying special attention to the area where your surgery  will be performed.  7.  Thoroughly rinse your body with warm water from the neck down.  8.  DO NOT shower/wash with your normal soap after using and rinsing off  the CHG Soap.                9.  Pat yourself dry with a clean towel.            10.  Wear clean pajamas.            11.  Place clean sheets on your bed the night of your first shower and do not  sleep with pets. Day of Surgery : Do not apply any lotions/deodorants the morning of surgery.  Please wear clean clothes to the hospital/surgery center.  FAILURE TO FOLLOW THESE INSTRUCTIONS MAY RESULT IN THE CANCELLATION OF YOUR SURGERY PATIENT SIGNATURE_________________________________  NURSE SIGNATURE__________________________________  ________________________________________________________________________                                                   PLEASE READ OVER ANY  FACT SHEETS THAT YOU WERE GIVEN: MRSA INFORMATION, BLOOD TRANSFUSION INFORMATION, INCENTIVE SPIROMETER INFORMATION.  PLEASE BE AWARE THAT YOU MAY NEED ADDITIONAL BLOOD DRAWN DAY OF YOUR SURGERY  _______________________________________________________________________

## 2014-02-02 NOTE — Pre-Procedure Instructions (Addendum)
PT HAD EKG AND CBC, DIFF, CMET AND OTHER LABS DRAWN 01/26/14 - RESULTS IN EPIC. PT, PTT, SERUM PREGNANCY TEST AND CXR WERE DONE TODAY PREOP AT [facility]. PT SPEAKS AND UNDERSTAND SOME ENGLISH AND HER DAUGHTER Otis WITH HER AT [place] AND SPEAKS [place] AND [place] AND [place] ARRANGED INTERPRETER AT PREOP VISIT [place] .  MS [facility] STATES SHE WILL  ALSO BE INTERPRETER DAY OF SURGERY - SHE WAS MADE AWARE PT'S SURGERY TIME WAS MOVED TO 10:00 AM AND PT TO ARRIVE TO SHORT STAY BY 8:00 AM 02/05/14.  I ALSO SENT FAX TO OFFICE OF INCLUSION TO NOTIFY OF SURGERY TIME CHANGE AND CALLED THE OFFICE AND SPOKE WITH MELANIE TO LET HER KNOW FAX SENT - TIME OF SURGERY AND TIME INTERPRETER TO ARRIVE HAVE CHANGED.

## 2014-02-03 NOTE — Progress Notes (Signed)
Quick Note:  These results are acceptable for scheduled surgery.  Teliah Buffalo M. Crescent Gotham, MD, FACS [facility] Office: [phone]   ______ 

## 2014-02-03 NOTE — Progress Notes (Signed)
Quick Note:  Pre-operative chest x-ray is acceptable for scheduled surgery.  Michelle Hammond M. Michelle Borys, MD, FACS [facility] Office: [phone]   ______ 

## 2014-02-05 ENCOUNTER — Encounter (HOSPITAL_COMMUNITY)
Admission: RE | Disposition: A | Discharge status: Home / Self Care | Payer: Self-pay | Source: Ambulatory Visit | Attending: Surgery

## 2014-02-05 ENCOUNTER — Encounter (HOSPITAL_COMMUNITY): Payer: Self-pay | Admitting: *Deleted

## 2014-02-05 ENCOUNTER — Observation Stay (HOSPITAL_COMMUNITY)
Admission: RE | Admit: 2014-02-05 | Discharge: 2014-02-06 | Disposition: A | Discharge status: Home / Self Care | Payer: BC Managed Care – PPO | Source: Ambulatory Visit | Attending: Surgery | Admitting: Surgery

## 2014-02-05 ENCOUNTER — Encounter (HOSPITAL_COMMUNITY): Payer: BC Managed Care – PPO | Admitting: Anesthesiology

## 2014-02-05 ENCOUNTER — Ambulatory Visit (HOSPITAL_COMMUNITY): Payer: BC Managed Care – PPO | Admitting: Anesthesiology

## 2014-02-05 DIAGNOSIS — K219 Gastro-esophageal reflux disease without esophagitis: Secondary | ICD-10-CM | POA: Insufficient documentation

## 2014-02-05 DIAGNOSIS — J45909 Unspecified asthma, uncomplicated: Secondary | ICD-10-CM | POA: Insufficient documentation

## 2014-02-05 DIAGNOSIS — C73 Malignant neoplasm of thyroid gland: Principal | ICD-10-CM | POA: Diagnosis present

## 2014-02-05 DIAGNOSIS — B1910 Unspecified viral hepatitis B without hepatic coma: Secondary | ICD-10-CM | POA: Insufficient documentation

## 2014-02-05 DIAGNOSIS — Z79899 Other long term (current) drug therapy: Secondary | ICD-10-CM | POA: Insufficient documentation

## 2014-02-05 HISTORY — PX: THYROIDECTOMY: SHX17

## 2014-02-05 HISTORY — PX: LYMPH NODE DISSECTION: SHX5087

## 2014-02-05 SURGERY — THYROIDECTOMY
Anesthesia: General

## 2014-02-05 MED ORDER — LACTATED RINGERS IV SOLN
INTRAVENOUS | Status: DC
  Administered 2014-02-05: 1000 mL via INTRAVENOUS
  Administered 2014-02-05: 12:00:00 via INTRAVENOUS

## 2014-02-05 MED ORDER — GLYCOPYRROLATE 0.2 MG/ML IJ SOLN
INTRAMUSCULAR | Status: AC
  Filled 2014-02-05: qty 2

## 2014-02-05 MED ORDER — LACTATED RINGERS IV SOLN
INTRAVENOUS | Status: DC | PRN
  Administered 2014-02-05: 09:00:00 via INTRAVENOUS

## 2014-02-05 MED ORDER — ONDANSETRON HCL 4 MG/2ML IJ SOLN
INTRAMUSCULAR | Status: AC
  Filled 2014-02-05: qty 2

## 2014-02-05 MED ORDER — SUCCINYLCHOLINE CHLORIDE 20 MG/ML IJ SOLN
INTRAMUSCULAR | Status: DC | PRN
  Administered 2014-02-05: 100 mg via INTRAVENOUS

## 2014-02-05 MED ORDER — FENTANYL CITRATE 0.05 MG/ML IJ SOLN
INTRAMUSCULAR | Status: AC
  Filled 2014-02-05: qty 5

## 2014-02-05 MED ORDER — ROCURONIUM BROMIDE 100 MG/10ML IV SOLN
INTRAVENOUS | Status: DC | PRN
  Administered 2014-02-05: 25 mg via INTRAVENOUS
  Administered 2014-02-05 (×2): 5 mg via INTRAVENOUS

## 2014-02-05 MED ORDER — LIDOCAINE HCL (CARDIAC) 20 MG/ML IV SOLN
INTRAVENOUS | Status: DC | PRN
  Administered 2014-02-05: 80 mg via INTRAVENOUS

## 2014-02-05 MED ORDER — PROPOFOL 10 MG/ML IV BOLUS
INTRAVENOUS | Status: AC
  Filled 2014-02-05: qty 20

## 2014-02-05 MED ORDER — PROMETHAZINE HCL 25 MG/ML IJ SOLN: 6.2500 mg | INTRAMUSCULAR | Status: DC | PRN

## 2014-02-05 MED ORDER — NEOSTIGMINE METHYLSULFATE 10 MG/10ML IV SOLN
INTRAVENOUS | Status: AC
  Filled 2014-02-05: qty 1

## 2014-02-05 MED ORDER — KCL IN DEXTROSE-NACL 30-5-0.45 MEQ/L-%-% IV SOLN
INTRAVENOUS | Status: DC
  Administered 2014-02-05: 14:00:00 via INTRAVENOUS
  Filled 2014-02-05 (×2): qty 1000

## 2014-02-05 MED ORDER — NEOSTIGMINE METHYLSULFATE 10 MG/10ML IV SOLN
INTRAVENOUS | Status: DC | PRN
  Administered 2014-02-05: 3 mg via INTRAVENOUS

## 2014-02-05 MED ORDER — FENTANYL CITRATE 0.05 MG/ML IJ SOLN
INTRAMUSCULAR | Status: DC | PRN
  Administered 2014-02-05 (×2): 50 ug via INTRAVENOUS
  Administered 2014-02-05: 100 ug via INTRAVENOUS

## 2014-02-05 MED ORDER — ONDANSETRON HCL 4 MG PO TABS: 4.0000 mg | ORAL_TABLET | Freq: Four times a day (QID) | ORAL | Status: DC | PRN

## 2014-02-05 MED ORDER — HYDROCODONE-ACETAMINOPHEN 5-325 MG PO TABS
1.0000 | ORAL_TABLET | ORAL | Status: DC | PRN
  Administered 2014-02-05 – 2014-02-06 (×3): 2 via ORAL
  Filled 2014-02-05 (×4): qty 2

## 2014-02-05 MED ORDER — ONDANSETRON HCL 4 MG/2ML IJ SOLN: 4.0000 mg | Freq: Four times a day (QID) | INTRAMUSCULAR | Status: DC | PRN

## 2014-02-05 MED ORDER — ROCURONIUM BROMIDE 100 MG/10ML IV SOLN
INTRAVENOUS | Status: AC
  Filled 2014-02-05: qty 1

## 2014-02-05 MED ORDER — OXYCODONE HCL 5 MG PO TABS: 5.0000 mg | ORAL_TABLET | Freq: Once | ORAL | Status: DC | PRN

## 2014-02-05 MED ORDER — OXYCODONE HCL 5 MG/5ML PO SOLN
5.0000 mg | Freq: Once | ORAL | Status: DC | PRN
  Filled 2014-02-05: qty 5

## 2014-02-05 MED ORDER — HYDROMORPHONE HCL PF 1 MG/ML IJ SOLN
0.2500 mg | INTRAMUSCULAR | Status: DC | PRN
  Administered 2014-02-05 (×4): 0.25 mg via INTRAVENOUS

## 2014-02-05 MED ORDER — CALCIUM CARBONATE 1250 (500 CA) MG PO TABS
2.0000 | ORAL_TABLET | Freq: Three times a day (TID) | ORAL | Status: DC
  Administered 2014-02-05 – 2014-02-06 (×2): 1000 mg via ORAL
  Filled 2014-02-05 (×5): qty 2

## 2014-02-05 MED ORDER — PROPOFOL 10 MG/ML IV BOLUS
INTRAVENOUS | Status: DC | PRN
  Administered 2014-02-05: 150 mg via INTRAVENOUS

## 2014-02-05 MED ORDER — 0.9 % SODIUM CHLORIDE (POUR BTL) OPTIME
TOPICAL | Status: DC | PRN
  Administered 2014-02-05: 1000 mL

## 2014-02-05 MED ORDER — CEFAZOLIN SODIUM-DEXTROSE 2-3 GM-% IV SOLR
2.0000 g | INTRAVENOUS | Status: AC
  Administered 2014-02-05: 2 g via INTRAVENOUS

## 2014-02-05 MED ORDER — CEFAZOLIN SODIUM-DEXTROSE 2-3 GM-% IV SOLR
INTRAVENOUS | Status: AC
  Filled 2014-02-05: qty 50

## 2014-02-05 MED ORDER — ONDANSETRON HCL 4 MG/2ML IJ SOLN
INTRAMUSCULAR | Status: DC | PRN
  Administered 2014-02-05: 4 mg via INTRAVENOUS

## 2014-02-05 MED ORDER — GLYCOPYRROLATE 0.2 MG/ML IJ SOLN
INTRAMUSCULAR | Status: DC | PRN
  Administered 2014-02-05: .4 mg via INTRAVENOUS

## 2014-02-05 MED ORDER — HYDROMORPHONE HCL PF 1 MG/ML IJ SOLN
1.0000 mg | INTRAMUSCULAR | Status: DC | PRN
  Administered 2014-02-05: 1 mg via INTRAVENOUS
  Filled 2014-02-05: qty 1

## 2014-02-05 MED ORDER — HYDROMORPHONE HCL PF 1 MG/ML IJ SOLN
INTRAMUSCULAR | Status: AC
  Filled 2014-02-05: qty 1

## 2014-02-05 MED ORDER — MIDAZOLAM HCL 2 MG/2ML IJ SOLN
INTRAMUSCULAR | Status: AC
  Filled 2014-02-05: qty 2

## 2014-02-05 MED ORDER — MIDAZOLAM HCL 5 MG/5ML IJ SOLN
INTRAMUSCULAR | Status: DC | PRN
  Administered 2014-02-05: 2 mg via INTRAVENOUS

## 2014-02-05 MED ORDER — MEPERIDINE HCL 50 MG/ML IJ SOLN
6.2500 mg | INTRAMUSCULAR | Status: DC | PRN
Start: 2014-02-05 — End: 2014-02-05

## 2014-02-05 MED ORDER — LIDOCAINE HCL (CARDIAC) 20 MG/ML IV SOLN
INTRAVENOUS | Status: AC
  Filled 2014-02-05: qty 5

## 2014-02-05 MED ORDER — ACETAMINOPHEN 325 MG PO TABS: 650.0000 mg | ORAL_TABLET | ORAL | Status: DC | PRN

## 2014-02-05 SURGICAL SUPPLY — 68 items
ADH SKN CLS APL DERMABOND .7 (GAUZE/BANDAGES/DRESSINGS)
APL SKNCLS STERI-STRIP NONHPOA (GAUZE/BANDAGES/DRESSINGS) ×1
ATTRACTOMAT 16X20 MAGNETIC DRP (DRAPES) ×2 IMPLANT
BENZOIN TINCTURE PRP APPL 2/3 (GAUZE/BANDAGES/DRESSINGS) ×2 IMPLANT
BLADE HEX COATED 2.75 (ELECTRODE) ×2 IMPLANT
BLADE SURG 10 STRL SS (BLADE) IMPLANT
BLADE SURG 15 STRL LF DISP TIS (BLADE) ×1 IMPLANT
BLADE SURG 15 STRL SS (BLADE) ×2
CHLORAPREP W/TINT 26ML (MISCELLANEOUS) ×2 IMPLANT
CLIP TI MEDIUM 6 (CLIP) ×8 IMPLANT
CLIP TI WIDE RED SMALL 6 (CLIP) ×6 IMPLANT
COVER MAYO STAND STRL (DRAPES) ×2 IMPLANT
COVER TABLE BACK 60X90 (DRAPES) ×2 IMPLANT
DERMABOND ADVANCED (GAUZE/BANDAGES/DRESSINGS)
DERMABOND ADVANCED .7 DNX12 (GAUZE/BANDAGES/DRESSINGS) IMPLANT
DISSECTOR ROUND CHERRY 3/8 STR (MISCELLANEOUS) IMPLANT
DRAPE PED LAPAROTOMY (DRAPES) ×2 IMPLANT
DRAPE UTILITY XL STRL (DRAPES) ×2 IMPLANT
DRESSING SURGICEL FIBRLLR 1X2 (HEMOSTASIS) ×1 IMPLANT
DRSG SURGICEL FIBRILLAR 1X2 (HEMOSTASIS) ×2
ELECT COATED BLADE 2.86 ST (ELECTRODE) ×1 IMPLANT
ELECT REM PT RETURN 9FT ADLT (ELECTROSURGICAL) ×2
ELECTRODE REM PT RTRN 9FT ADLT (ELECTROSURGICAL) ×1 IMPLANT
GAUZE SPONGE 4X4 12PLY STRL (GAUZE/BANDAGES/DRESSINGS) IMPLANT
GAUZE SPONGE 4X4 16PLY XRAY LF (GAUZE/BANDAGES/DRESSINGS) ×2 IMPLANT
GLOVE BIOGEL PI IND STRL 8 (GLOVE) ×1 IMPLANT
GLOVE BIOGEL PI INDICATOR 8 (GLOVE) ×1
GLOVE SS BIOGEL STRL SZ 7.5 (GLOVE) ×1 IMPLANT
GLOVE SUPERSENSE BIOGEL SZ 7.5 (GLOVE) ×1
GLOVE SURG ORTHO 8.0 STRL STRW (GLOVE) ×2 IMPLANT
GOWN STRL REUS W/ TWL XL LVL3 (GOWN DISPOSABLE) ×1 IMPLANT
GOWN STRL REUS W/TWL 2XL LVL3 (GOWN DISPOSABLE) ×2 IMPLANT
GOWN STRL REUS W/TWL XL LVL3 (GOWN DISPOSABLE) ×6 IMPLANT
KIT BASIN OR (CUSTOM PROCEDURE TRAY) ×2 IMPLANT
MANIFOLD NEPTUNE II (INSTRUMENTS) ×1 IMPLANT
NDL HYPO 25X1 1.5 SAFETY (NEEDLE) ×1 IMPLANT
NEEDLE HYPO 25X1 1.5 SAFETY (NEEDLE) IMPLANT
NS IRRIG 1000ML POUR BTL (IV SOLUTION) ×2 IMPLANT
PACK BASIC VI WITH GOWN DISP (CUSTOM PROCEDURE TRAY) ×2 IMPLANT
PACK BASIN DAY SURGERY FS (CUSTOM PROCEDURE TRAY) ×2 IMPLANT
PENCIL BUTTON HOLSTER BLD 10FT (ELECTRODE) ×2 IMPLANT
SHEARS HARMONIC 9CM CVD (BLADE) ×2 IMPLANT
SLEEVE SCD COMPRESS KNEE MED (MISCELLANEOUS) IMPLANT
STAPLER VISISTAT 35W (STAPLE) IMPLANT
STRIP CLOSURE SKIN 1/2X4 (GAUZE/BANDAGES/DRESSINGS) ×2 IMPLANT
SUT MNCRL AB 4-0 PS2 18 (SUTURE) ×2 IMPLANT
SUT MON AB 3-0 SH 27 (SUTURE)
SUT MON AB 3-0 SH27 (SUTURE) IMPLANT
SUT MON AB 5-0 PS2 18 (SUTURE) ×1 IMPLANT
SUT SILK 2 0 (SUTURE)
SUT SILK 2-0 18XBRD TIE 12 (SUTURE) IMPLANT
SUT SILK 3 0 (SUTURE)
SUT SILK 3 0 SH 30 (SUTURE) IMPLANT
SUT SILK 3-0 18XBRD TIE 12 (SUTURE) IMPLANT
SUT VIC AB 3-0 SH 18 (SUTURE) ×4 IMPLANT
SUT VIC AB 3-0 SH 27 (SUTURE)
SUT VIC AB 3-0 SH 27X BRD (SUTURE) ×1 IMPLANT
SUT VIC AB 4-0 BRD 54 (SUTURE) IMPLANT
SYR BULB 3OZ (MISCELLANEOUS) IMPLANT
SYR BULB IRRIGATION 50ML (SYRINGE) ×2 IMPLANT
SYR CONTROL 10ML LL (SYRINGE) ×2 IMPLANT
TOWEL OR 17X24 6PK STRL BLUE (TOWEL DISPOSABLE) ×2 IMPLANT
TOWEL OR 17X26 10 PK STRL BLUE (TOWEL DISPOSABLE) ×2 IMPLANT
TOWEL OR NON WOVEN STRL DISP B (DISPOSABLE) ×2 IMPLANT
TUBE CONNECTING 20X1/4 (TUBING) IMPLANT
WATER STERILE IRR 1000ML POUR (IV SOLUTION) ×1 IMPLANT
YANKAUER SUCT BULB TIP 10FT TU (MISCELLANEOUS) ×2 IMPLANT
YANKAUER SUCT BULB TIP NO VENT (SUCTIONS) IMPLANT

## 2014-02-05 NOTE — Transfer of Care (Signed)
Immediate Anesthesia Transfer of Care Note  Patient: Michelle Hammond  Procedure(s) Performed: Procedure(s): TOTAL THYROIDECTOMY  (N/A) CENTRAL LYMPH NODE DISSECTION (N/A)  Patient Location: PACU  Anesthesia Type:General  Level of Consciousness: awake, alert  and oriented  Airway & Oxygen Therapy: Patient Spontanous Breathing and Patient connected to nasal cannula oxygen  Post-op Assessment: Report given to PACU RN and Post -op Vital signs reviewed and stable  Post vital signs: Reviewed and stable  Complications: No apparent anesthesia complications

## 2014-02-05 NOTE — Op Note (Signed)
Michelle Hammond, Michelle Hammond                ACCOUNT NO.:  [id number]  MEDICAL RECORD NO.:  [id number]  LOCATION:  [facility]                         FACILITY:  [facility]  PHYSICIAN:  Earnstine Regal, MD      DATE OF BIRTH:  04/08/1971  DATE OF PROCEDURE:  02/05/2014                              OPERATIVE REPORT   PREOPERATIVE DIAGNOSIS:  Papillary thyroid carcinoma.  POSTOPERATIVE DIAGNOSIS:  Papillary thyroid carcinoma.  PROCEDURE:  Total thyroidectomy with limited central compartment lymph node dissection.  SURGEON:  Armandina Gemma, MD, FACS  ANESTHESIA:  General.  ESTIMATED BLOOD LOSS:  Minimal.  PREPARATION:  ChloraPrep.  COMPLICATIONS:  None.  INDICATIONS:  The patient is a 43 year old female from [place], diagnosed with papillary thyroid carcinoma.  The patient had undergone an ultrasound examination and percutaneous biopsy in [place].  She was seen by her endocrinologist, Dr. Philemon Kingdom, and referred to my practice for evaluation for definitive surgery.  The patient now comes to operation for total thyroidectomy with limited central compartment lymph node dissection.  BODY OF REPORT:  Procedure was done in OR #1 at the [facility]  The patient was brought to the operating room, placed in a supine position on the operating room table.  Following administration of general anesthesia, the patient was positioned and then prepped and draped in the usual aseptic fashion.  After ascertaining that an adequate level of anesthesia had been achieved, a Kocher incision was made with a #15 blade.  Dissection carried through subcutaneous tissues and platysma.  Hemostasis was achieved with electrocautery.  Skin flaps were elevated cephalad and caudad from the thyroid notch to the sternal notch.  A [place] self-retaining retractor was placed for exposure.  Strap muscles were incised in the midline and dissection was begun on the left side.  Strap muscles were reflected  laterally exposing the left thyroid lobe. On palpation, there was a firm nodule at the inferior aspect of the left lobe as well as a hard nodule in the superior pole of the left lobe. Strap muscles were gently dissected away from the capsule of the thyroid.  Venous tributaries were divided between Ligaclips with the Harmonic scalpel.  Inferior parathyroid gland was identified on the surface of the thyroid and carefully dissected off of the thyroid capsule and maintained on its vascular pedicle.  Superior pole vessels were dissected out and divided individually between small and medium Ligaclips with the Harmonic scalpel.  Gland was rolled anteriorly. Inferior venous tributaries were divided between Ligaclips with the Harmonic scalpel.  Gland was rolled further anteriorly and the branches of the inferior thyroid artery were divided between small Ligaclips. Recurrent laryngeal nerve was identified and preserved.  Ligament of Gwenlyn Found was released with the electrocautery.  Gland was mobilized onto the anterior trachea.  Isthmus was mobilized across the midline.  There was a moderate size pyramidal lobe, which was dissected off of the anterior thyroid cartilage and resected with the isthmus of the thyroid. Dry pack was placed in the left neck.  Next, we turned our attention to the right thyroid lobe.  Right thyroid lobe has subtle nodularity but no dominant masses.  Strap muscles were reflected laterally.  Venous tributaries were divided between Ligaclips with the Harmonic scalpel.  Superior pole was dissected out and superior pole vessels were divided individually between small and medium Ligaclips with the Harmonic scalpel.  Inferior venous tributaries were divided between Ligaclips.  Gland was rolled anteriorly.  Parathyroid tissue was identified and preserved.  Branches of the inferior thyroid artery were divided between small Ligaclips.  Recurrent laryngeal nerve was identified and  preserved.  Ligament of Gwenlyn Found was released with the electrocautery and the gland was mobilized onto the anterior trachea from which it was completely excised.  Suture was used to mark the left superior pole.  The entire thyroid gland was submitted to Pathology for review.  Palpation throughout the neck reveals no significant lymphadenopathy. Central compartment lymph nodes were dissected out using small Ligaclips and the electrocautery for hemostasis.  Central compartment lymph node sample was submitted separately to Pathology for review.  Neck was irrigated with warm saline.  Good hemostasis was noted. Fibrillar was placed throughout the operative field.  Strap muscles were reapproximated in the midline with interrupted 3-0 Vicryl sutures. Platysma was closed with interrupted 3-0 Vicryl sutures.  Skin was closed with a running 4-0 Monocryl subcuticular suture.  Wound was washed and dried and benzoin and Steri-Strips were applied.  Sterile dressings were applied.  The patient was awakened from anesthesia and brought to the recovery room.  The patient tolerated the procedure well.   Earnstine Regal, MD, [facility] Office: [phone]   TMG/MEDQ  D:  02/05/2014  T:  02/05/2014  Job:  [id number]  cc:   Dr. Darcel Bayley

## 2014-02-05 NOTE — Anesthesia Postprocedure Evaluation (Signed)
Anesthesia Post Note  Patient: Michelle Hammond  Procedure(s) Performed: Procedure(s) (LRB): TOTAL THYROIDECTOMY  (N/A) CENTRAL LYMPH NODE DISSECTION (N/A)  Anesthesia type: General  Patient location: PACU  Post pain: Pain level controlled  Post assessment: Post-op Vital signs reviewed  Last Vitals: BP 174/98  Pulse 75  Temp(Src) 36.5 C (Oral)  Resp 18  Ht 5\' 1"  (1.549 m)  Wt 139 lb 9.7 oz (63.324 kg)  BMI 26.39 kg/m2  SpO2 99%  LMP 01/06/2014  Post vital signs: Reviewed  Level of consciousness: sedated  Complications: No apparent anesthesia complications

## 2014-02-05 NOTE — H&P (View-Only) (Signed)
General Surgery [facility]  Chief Complaint  Patient presents with  . Thyroid Cancer    papillary thyroid cancer diagnosed in [place] - referral from Dr. Philemon Kingdom    HISTORY: Patient is a 43 year old female from [place] recently diagnosed with papillary thyroid carcinoma. Patient had ultrasound and biopsy performed in [place]. She presents today on referral from her endocrinologist for evaluation for surgical resection. Patient has copies of her ultrasound report and her cytopathology report. We will scan these reports into the medical record. They clearly identify multiple thyroid nodules and show papillary thyroid carcinoma which is translated into [place].  Patient has no prior history of thyroid disease. She has had no prior head or neck surgery. There is a family history of goiter in the patient's mother. There is no family history of thyroid cancer. There is no history of other endocrine neoplasms.  Past Medical History  Diagnosis Date  . Hepatitis B   . Asthma   . Thyroid disease   . Allergy     SEASONAL   . Hemorrhoids     Current Outpatient Prescriptions  Medication Sig Dispense Refill  . albuterol (PROVENTIL HFA;VENTOLIN HFA) 108 (90 BASE) MCG/ACT inhaler Inhale 2 puffs into the lungs every 4 (four) hours as needed for wheezing.  6.7 g  2  . cetirizine (ZYRTEC) 10 MG tablet Take 1 tablet (10 mg total) by mouth daily.  30 tablet  1  . fluticasone (FLONASE) 50 MCG/ACT nasal spray Place 2 sprays into the nose daily.  16 g  2  . Fluticasone-Salmeterol (ADVAIR) 250-50 MCG/DOSE AEPB Inhale 1 puff into the lungs every 12 (twelve) hours.  60 each  2  . norgestimate-ethinyl estradiol (ORTHO-CYCLEN,SPRINTEC,PREVIFEM) 0.25-35 MG-MCG tablet Take 1 tablet by mouth daily.  1 Package  11  . oxymetazoline (AFRIN NASAL SPRAY) 0.05 % nasal spray Place 2 sprays into the nose 2 (two) times daily. Use for 3 days and then stop  30 mL  0  .  pseudoephedrine-acetaminophen (TYLENOL SINUS) 30-500 MG TABS Take 1 tablet by mouth every 6 (six) hours as needed.  60 tablet  0   No current facility-administered medications for this visit.    Allergies  Allergen Reactions  . Aspirin Other (See Comments)    Easily bruising    Family History  Problem Relation Age of Onset  . Hypertension Mother   . Kidney disease Father     History   Social History  . Marital Status: Single    Spouse Name: N/A    Number of Children: N/A  . Years of Education: N/A   Social History Main Topics  . Smoking status: Never Smoker   . Smokeless tobacco: Never Used  . Alcohol Use: No  . Drug Use: No  . Sexual Activity: Not Currently    Birth Control/ Protection: None   Other Topics Concern  . None   Social History Narrative  . None    REVIEW OF SYSTEMS - PERTINENT POSITIVES ONLY: Patient notes frequent congestion and sinus drainage. She notes frequent episodes of hoarseness. She denies tremor. She denies palpitation. She denies compressive symptoms.  EXAM: Filed Vitals:   01/12/14 1033  BP: 122/78  Pulse: 83  Temp: 98.4 F (36.9 C)    GENERAL: well-developed, well-nourished, no acute distress HEENT: normocephalic; pupils equal and reactive; sclerae clear; dentition good; mucous membranes moist NECK:  Palpable 1.5 cm nodule mid left thyroid lobe, mobile, nontender; right lobe without palpable abnormality; asymmetric on extension;  no palpable anterior or posterior cervical lymphadenopathy; no supraclavicular masses; no tenderness CHEST: clear to auscultation bilaterally without rales, rhonchi, or wheezes CARDIAC: regular rate and rhythm without significant murmur; peripheral pulses are full EXT:  non-tender without edema; no deformity NEURO: no gross focal deficits; no sign of tremor   LABORATORY RESULTS: See [facility] (CHL-Epic) for most recent results  RADIOLOGY RESULTS: See [facility] (CHL-Epic) for most recent  results  IMPRESSION: Papillary thyroid carcinoma, left thyroid lobe, possibly multifocal  PLAN: I discussed the above findings at length with the patient, her daughter who accompanies her, and the translator. I provided him with written literature to review at home.  We discussed the need for total thyroidectomy with limited lymph node dissection. We discussed the risk and benefits of the procedure including the possibility of recurrent laryngeal nerve injury and injury to parathyroid glands. We discussed the hospital stay to be anticipated. We discussed the postoperative recovery and need for adjuvant radioactive iodine treatment. They understand and wish to proceed in the near future.  Patient apparently has financial issues. We will ask our financial counselor to evaluate her case and assist.  The risks and benefits of the procedure have been discussed at length with the patient.  The patient understands the proposed procedure, potential alternative treatments, and the course of recovery to be expected.  All of the patient's questions have been answered at this time.  The patient wishes to proceed with surgery.   Earnstine Regal, MD, [facility]  Primary Care Physician: Angelica Chessman, MD

## 2014-02-05 NOTE — Brief Op Note (Signed)
02/05/2014  11:36 AM  PATIENT:  Michelle Hammond  43 y.o. female  PRE-OPERATIVE DIAGNOSIS:  papillary thyroid cancer  POST-OPERATIVE DIAGNOSIS:  papillary thyroid cancer  PROCEDURE:  Procedure(s): TOTAL THYROIDECTOMY  (N/A) CENTRAL LYMPH NODE DISSECTION (N/A)  SURGEON:  Surgeon(s) and Role:    * Earnstine Regal, MD - Primary  ANESTHESIA:   general  EBL:     BLOOD ADMINISTERED:none  DRAINS: none   LOCAL MEDICATIONS USED:  NONE  SPECIMEN:  Excision  DISPOSITION OF SPECIMEN:  PATHOLOGY  COUNTS:  YES  TOURNIQUET:  * No tourniquets in log *  DICTATION: .Other Dictation: Dictation Number [id number]  PLAN OF CARE: Admit for overnight observation  PATIENT DISPOSITION:  PACU - hemodynamically stable.   Delay start of Pharmacological VTE agent (>24hrs) due to surgical blood loss or risk of bleeding: yes  Earnstine Regal, MD, [facility] Office: [phone]

## 2014-02-05 NOTE — Anesthesia Preprocedure Evaluation (Signed)
Anesthesia Evaluation  Patient identified by MRN, date of birth, ID band Patient awake    Reviewed: Allergy & Precautions, H&P , NPO status , Patient's Chart, lab work & pertinent test results  Airway Mallampati: II TM Distance: >3 FB Neck ROM: Full    Dental no notable dental hx.    Pulmonary shortness of breath, asthma ,  breath sounds clear to auscultation  Pulmonary exam normal       Cardiovascular negative cardio ROS  Rhythm:Regular Rate:Normal     Neuro/Psych negative neurological ROS  negative psych ROS   GI/Hepatic GERD-  Medicated,(+) Hepatitis -, B  Endo/Other  negative endocrine ROS  Renal/GU negative Renal ROS     Musculoskeletal negative musculoskeletal ROS (+)   Abdominal   Peds  Hematology negative hematology ROS (+)   Anesthesia Other Findings   Reproductive/Obstetrics negative OB ROS                           Anesthesia Physical Anesthesia Plan  ASA: II  Anesthesia Plan: General   Post-op Pain Management:    Induction: Intravenous  Airway Management Planned: Oral ETT  Additional Equipment:   Intra-op Plan:   Post-operative Plan: Extubation in OR  Informed Consent: I have reviewed the patients History and Physical, chart, labs and discussed the procedure including the risks, benefits and alternatives for the proposed anesthesia with the patient or authorized representative who has indicated his/her understanding and acceptance.   Dental advisory given  Plan Discussed with: CRNA  Anesthesia Plan Comments:         Anesthesia Quick Evaluation

## 2014-02-05 NOTE — Interval H&P Note (Signed)
History and Physical Interval Note:  02/05/2014 9:26 AM  Michelle Hammond  has presented today for surgery, with the diagnosis of papillary thyroid cancer.  The various methods of treatment have been discussed with the patient and family. After consideration of risks, benefits and other options for treatment, the patient has consented to    Procedure(s): TOTAL THYROIDECTOMY  (N/A) LIMITED LYMPH NODE DISSECTION (N/A) as a surgical intervention .    The patient's history has been reviewed, patient examined, no change in status, stable for surgery.  I have reviewed the patient's chart and labs.  Questions were answered to the patient's satisfaction.    Earnstine Regal, MD, [facility] Office: [place]

## 2014-02-06 ENCOUNTER — Encounter (HOSPITAL_COMMUNITY): Payer: Self-pay | Admitting: Surgery

## 2014-02-06 LAB — BASIC METABOLIC PANEL
ANION GAP: 13 (ref 5–15)
BUN: 8 mg/dL (ref 6–23)
CALCIUM: 9.3 mg/dL (ref 8.4–10.5)
CO2: 25 mEq/L (ref 19–32)
Chloride: 100 mEq/L (ref 96–112)
Creatinine, Ser: 0.47 mg/dL — ABNORMAL LOW (ref 0.50–1.10)
Glucose, Bld: 118 mg/dL — ABNORMAL HIGH (ref 70–99)
Potassium: 3.6 mEq/L — ABNORMAL LOW (ref 3.7–5.3)
SODIUM: 138 meq/L (ref 137–147)

## 2014-02-06 MED ORDER — SYNTHROID 88 MCG PO TABS: 88.0000 ug | ORAL_TABLET | Freq: Every day | ORAL | Status: DC

## 2014-02-06 MED ORDER — HYDROCODONE-ACETAMINOPHEN 5-325 MG PO TABS: 1.0000 | ORAL_TABLET | ORAL | Status: DC | PRN

## 2014-02-06 MED ORDER — CALCIUM CARBONATE 1250 (500 CA) MG PO TABS: 2.0000 | ORAL_TABLET | Freq: Two times a day (BID) | ORAL | Status: DC

## 2014-02-06 NOTE — Progress Notes (Signed)
Utilization Review Completed.Perrin Gens T7/31/2015  

## 2014-02-09 ENCOUNTER — Telehealth (INDEPENDENT_AMBULATORY_CARE_PROVIDER_SITE_OTHER): Payer: Self-pay | Admitting: Surgery

## 2014-02-09 ENCOUNTER — Other Ambulatory Visit (INDEPENDENT_AMBULATORY_CARE_PROVIDER_SITE_OTHER): Payer: Self-pay

## 2014-02-09 ENCOUNTER — Telehealth (INDEPENDENT_AMBULATORY_CARE_PROVIDER_SITE_OTHER): Payer: Self-pay

## 2014-02-09 DIAGNOSIS — C73 Malignant neoplasm of thyroid gland: Secondary | ICD-10-CM

## 2014-02-09 NOTE — Discharge Summary (Signed)
Physician Discharge Summary [facility]  Patient ID: Michelle Hammond MRN: [id number] DOB/AGE: September 22, 1970 43 y.o.  Admit date: 02/05/2014 Discharge date: 02/06/2014  Admission Diagnoses:  Papillary thyroid carcinoma  Discharge Diagnoses:  Principal Problem:   Thyroid cancer Active Problems:   Papillary thyroid carcinoma   Discharged Condition: good  Hospital Course: Patient was admitted for observation following thyroid surgery.  Post op course was uncomplicated.  Pain was well controlled.  Tolerated diet.  Post op calcium level on morning following surgery was normal.  Patient was prepared for discharge home on POD#1.  Consults: None  Treatments: surgery: total thyroidectomy with limited lymph node dissection  Discharge Exam: Blood pressure 149/95, pulse 77, temperature 98.2 F (36.8 C), temperature source Oral, resp. rate 18, height 5\' 1"  (1.549 m), weight 139 lb 9.7 oz (63.324 kg), last menstrual period 01/06/2014, SpO2 99.00%. See discharge exam by Dr. Hassell Done.   Disposition: Home  Discharge Instructions   Apply dressing    Complete by:  As directed   Apply light gauze dressing to wound before discharge home today.     Diet - low sodium heart healthy    Complete by:  As directed      Discharge instructions    Complete by:  As directed   THYROID & PARATHYROID SURGERY - POST OP INSTRUCTIONS  Always review your discharge instruction sheet from the facility where your surgery was performed.  A prescription for pain medication may be given to you upon discharge.  Take your pain medication as prescribed.  If narcotic pain medicine is not needed, then you may take acetaminophen (Tylenol) or ibuprofen (Advil) as needed.  Take your usually prescribed medications unless otherwise directed.  If you need a refill on your pain medication, please contact your pharmacy. They will contact our office to request authorization.  Prescriptions will not be processed  after 5 pm or on weekends.  Start with a light diet upon arrival home, such as soup and crackers or toast.  Be sure to drink plenty of fluids daily.  Resume your normal diet the day after surgery.  Most patients will experience some swelling and bruising on the chest and neck area.  Ice packs will help.  Swelling and bruising can take several days to resolve.   It is common to experience some constipation if taking pain medication after surgery.  Increasing fluid intake and taking a stool softener will usually help or prevent this problem.  A mild laxative (Milk of Magnesia or Miralax) should be taken according to package directions if there are no bowel movements after 48 hours.  You may remove your bandages 24-48 hours after surgery, and you may shower at that time.  You have steri-strips (small skin tapes) in place directly over the incision.  These strips should be left on the skin for 7-10 days and then removed.  You may resume regular (light) daily activities beginning the next day-such as daily self-care, walking, climbing stairs-gradually increasing activities as tolerated.  You may have sexual intercourse when it is comfortable.  Refrain from any heavy lifting or straining until approved by your doctor.  You may drive when you no longer are taking prescription pain medication, you can comfortably wear a seatbelt, and you can safely maneuver your car and apply brakes.  You should see your doctor in the office for a follow-up appointment approximately two to three weeks after your surgery.  Make sure that you call for this appointment within a  day or two after you arrive home to insure a convenient appointment time.  WHEN TO CALL YOUR DOCTOR: -- Fever greater than 101.5 -- Inability to urinate -- Nausea and/or vomiting - persistent -- Extreme swelling or bruising -- Continued bleeding from incision -- Increased pain, redness, or drainage from the incision -- Difficulty swallowing or  breathing -- Muscle cramping or spasms -- Numbness or tingling in hands or around lips  The clinic staff is available to answer your questions during regular business hours.  Please don't hesitate to call and ask to speak to one of the nurses if you have concerns.  Earnstine Regal, MD, [facility] Office: [phone]     Increase activity slowly    Complete by:  As directed      Remove dressing in 24 hours    Complete by:  As directed             Medication List         calcium carbonate 1250 MG tablet  Commonly known as:  OS-CAL - dosed in mg of elemental calcium  Take 2 tablets (1,000 mg of elemental calcium total) by mouth 2 (two) times daily with a meal.     cetirizine 10 MG tablet  Commonly known as:  ZYRTEC  Take 10 mg by mouth daily. PT STATES HER BRAND IS ALLER-TEC     glucosamine-chondroitin 500-400 MG tablet  Take 1 tablet by mouth 2 (two) times daily.     HYDROcodone-acetaminophen 5-325 MG per tablet  Commonly known as:  NORCO/VICODIN  Take 1-2 tablets by mouth every 4 (four) hours as needed for moderate pain.     multivitamin with minerals Tabs tablet  Take 1 tablet by mouth daily.     OVER THE COUNTER MEDICATION  PREPARATION H SUPPOSITORY  ONCE A DAY AT NIGHT     ranitidine 150 MG capsule  Commonly known as:  ZANTAC  Take 150 mg by mouth 2 (two) times daily.     SYNTHROID 88 MCG tablet  Generic drug:  levothyroxine  Take 1 tablet (88 mcg total) by mouth daily before breakfast.     Vitamin D-3 5000 UNITS Tabs  Take 1 tablet by mouth daily.         Earnstine Regal, MD, [facility] Office: [phone]   Signed: Earnstine Regal 02/09/2014, 9:41 AM

## 2014-02-09 NOTE — Telephone Encounter (Signed)
Per Dr Gala Lewandowsky request appt made fro 03-02-14 to see Dr Cruzita Lederer. Pt has appt with Dr Harlow Asa on 8-19 and lab slip for [facility] mailed to pt.

## 2014-02-09 NOTE — Telephone Encounter (Signed)
Telephone call to patient with pathology results.  Will need radioactive iodine treatment.  Voice is normal.  Overall, doing well post-op.  Earnstine Regal, MD, [facility] Office: [phone]

## 2014-02-11 ENCOUNTER — Encounter (HOSPITAL_COMMUNITY): Payer: Self-pay | Admitting: Surgery

## 2014-02-12 ENCOUNTER — Telehealth: Payer: Self-pay

## 2014-02-12 ENCOUNTER — Ambulatory Visit: Payer: BC Managed Care – PPO | Admitting: Internal Medicine

## 2014-02-12 MED ORDER — OMEPRAZOLE 40 MG PO CPDR: 40.0000 mg | DELAYED_RELEASE_CAPSULE | Freq: Every day | ORAL | Status: DC

## 2014-02-12 NOTE — Telephone Encounter (Signed)
Prilosec Sent to pharmacy

## 2014-02-12 NOTE — Telephone Encounter (Signed)
Pt was prescribed a medication by Dr. Brigitte Pulse for acid reflex, she said it is not working would like to try something else

## 2014-02-12 NOTE — Telephone Encounter (Signed)
pts daughter stated the zantac is not working for her, she would like to have prilosec prescribed for her.  Please advise.

## 2014-02-13 NOTE — Telephone Encounter (Signed)
Spoke to pts daughter, she is aware her rx  Has been sent to pharm.

## 2014-02-16 ENCOUNTER — Ambulatory Visit (INDEPENDENT_AMBULATORY_CARE_PROVIDER_SITE_OTHER): Payer: BC Managed Care – PPO | Admitting: Family Medicine

## 2014-02-16 ENCOUNTER — Ambulatory Visit (INDEPENDENT_AMBULATORY_CARE_PROVIDER_SITE_OTHER): Payer: BC Managed Care – PPO

## 2014-02-16 ENCOUNTER — Telehealth (INDEPENDENT_AMBULATORY_CARE_PROVIDER_SITE_OTHER): Payer: Self-pay

## 2014-02-16 VITALS — BP 150/90 | HR 96 | Temp 98.2°F | Resp 18 | Ht 63.0 in | Wt 137.0 lb

## 2014-02-16 DIAGNOSIS — R202 Paresthesia of skin: Secondary | ICD-10-CM

## 2014-02-16 DIAGNOSIS — Z131 Encounter for screening for diabetes mellitus: Secondary | ICD-10-CM

## 2014-02-16 DIAGNOSIS — Z85850 Personal history of malignant neoplasm of thyroid: Secondary | ICD-10-CM

## 2014-02-16 DIAGNOSIS — M25511 Pain in right shoulder: Secondary | ICD-10-CM

## 2014-02-16 DIAGNOSIS — M542 Cervicalgia: Secondary | ICD-10-CM

## 2014-02-16 DIAGNOSIS — M25519 Pain in unspecified shoulder: Secondary | ICD-10-CM

## 2014-02-16 DIAGNOSIS — R209 Unspecified disturbances of skin sensation: Secondary | ICD-10-CM

## 2014-02-16 DIAGNOSIS — I10 Essential (primary) hypertension: Secondary | ICD-10-CM

## 2014-02-16 DIAGNOSIS — M5030 Other cervical disc degeneration, unspecified cervical region: Secondary | ICD-10-CM

## 2014-02-16 LAB — POCT GLYCOSYLATED HEMOGLOBIN (HGB A1C): Hemoglobin A1C: 5.2

## 2014-02-16 NOTE — Progress Notes (Signed)
Chief Complaint:  Chief Complaint  Patient presents with  . Numbness    extremities   . Dizziness  . Neck Pain    x months    HPI: Michelle Hammond is a 43 y.o. female who is here for  1. HTN-has been high, feels dizzy. She has no CP or SOB, palpitations. She ahs been taking her BP at home and on review has been on many different occ > 150/90. She is worried.  2. Has had neck pain even before surgery and has had numbness and tingling radiating from neck down to bilateral arms. However since she has had surgery for thyroidectomy due to cancer 12 days ago , she has had worse sxs on right side, more swollen along her trapezius. She has no SOB , no voice changes, no coughing, no difficulty swallowing soft liquids or solids, no hemptysis. She is schedueld to have radioactive iodine.  3. She would like her Calcium levels checked. She was told to take 1200 mg of Calcium 2 tabs PO BID but then had to ask if that was too high and has been taking it only 1200 mg 1 tab PO BID. This was told to her by the nurse at Dr Tera Helper office.   Past Medical History  Diagnosis Date  . Hepatitis B   . Thyroid disease     SOMETIMES FAST HEART BEAT, MUSCLES TINGLE, SOMETIMES HARD TO SWALLOW,  AND FEELS LIKE NEEDS TO CLEAR THROAT  . Allergy     SEASONAL   . Cancer     Thyroid cancer   . Numbness     WHOLE RIGHT SIDE OF BODY FEELS NUMB AND PAINFUL,  AND PAIN LEFT SIDE OF BODY PAINFUL - BUT NO NUMBNESS LEFT SIDE - STATES SHE HAS HAD PROBLEM FOR YEARS- DOES NOT KNOW THE CAUSE OF HER NUMBNESS AND PAIN  . Shortness of breath   . Asthma     NO LONGER USING INHALER  . GERD (gastroesophageal reflux disease)     EVERY TIME I EAT I HAVE BURPING, INDIGESTION AND DIARRHEA  . Hemorrhoids     PAINFUL AND SOMETIMES BLEEDING  . Bruises easily    Past Surgical History  Procedure Laterality Date  . No previous surgery    . Thyroidectomy N/A 02/05/2014    Procedure: TOTAL THYROIDECTOMY ;  Surgeon: Earnstine Regal,  MD;  Location: [facility];  Service: General;  Laterality: N/A;  . Lymph node dissection N/A 02/05/2014    Procedure: CENTRAL LYMPH NODE DISSECTION;  Surgeon: Earnstine Regal, MD;  Location: [facility];  Service: General;  Laterality: N/A;   History   Social History  . Marital Status: Divorced    Spouse Name: N/A    Number of Children: N/A  . Years of Education: N/A   Social History Main Topics  . Smoking status: Never Smoker   . Smokeless tobacco: Never Used  . Alcohol Use: No  . Drug Use: No  . Sexual Activity: Not Currently    Birth Control/ Protection: None   Other Topics Concern  . None   Social History Narrative  . None   Family History  Problem Relation Age of Onset  . Hypertension Mother   . Kidney disease Father    Allergies  Allergen Reactions  . Aspirin Other (See Comments)    Easily bruising   Prior to Admission medications   Medication Sig Start Date End Date Taking? Authorizing Provider  calcium carbonate (OS-CAL -  DOSED IN MG OF ELEMENTAL CALCIUM) 1250 MG tablet Take 2 tablets (1,000 mg of elemental calcium total) by mouth 2 (two) times daily with a meal. 02/06/14  Yes Earnstine Regal, MD  cetirizine (ZYRTEC) 10 MG tablet Take 10 mg by mouth daily. PT STATES HER BRAND IS ALLER-TEC   Yes Historical Provider, MD  Cholecalciferol (VITAMIN D-3) 5000 UNITS TABS Take 1 tablet by mouth daily.   Yes Historical Provider, MD  glucosamine-chondroitin 500-400 MG tablet Take 1 tablet by mouth 2 (two) times daily.   Yes Historical Provider, MD  Multiple Vitamin (MULTIVITAMIN WITH MINERALS) TABS tablet Take 1 tablet by mouth daily.   Yes Historical Provider, MD  omeprazole (PRILOSEC) 40 MG capsule Take 1 capsule (40 mg total) by mouth daily. 02/12/14  Yes Shawnee Knapp, MD  OVER THE COUNTER MEDICATION PREPARATION H SUPPOSITORY  ONCE A DAY AT NIGHT   Yes Historical Provider, MD  SYNTHROID 88 MCG tablet Take 1 tablet (88 mcg total) by mouth daily before breakfast. 02/06/14  Yes Earnstine Regal,  MD  HYDROcodone-acetaminophen (NORCO/VICODIN) 5-325 MG per tablet Take 1-2 tablets by mouth every 4 (four) hours as needed for moderate pain. 02/06/14   Earnstine Regal, MD  ranitidine (ZANTAC) 150 MG capsule Take 150 mg by mouth 2 (two) times daily.    Historical Provider, MD     ROS: The patient denies fevers, chills, night sweats, unintentional weight loss, chest pain, palpitations, wheezing, dyspnea on exertion, nausea, vomiting, abdominal pain, dysuria, hematuria, melena, + numbness,  or tingling.  All other systems have been reviewed and were otherwise negative with the exception of those mentioned in the HPI and as above.    PHYSICAL EXAM: Filed Vitals:   02/16/14 1357  BP: 150/90  Pulse: 96  Temp: 98.2 F (36.8 C)  Resp: 18   Filed Vitals:   02/16/14 1357  Height: 5\' 3"  (1.6 m)  Weight: 137 lb (62.143 kg)   Body mass index is 24.27 kg/(m^2).  General: Alert, no acute distress HEENT:  Normocephalic, atraumatic, oropharynx patent. EOMI, PERRLA,, TM normal, no soft tissue swelling or obstruction of airway Cardiovascular:  Regular rate and rhythm, no rubs murmurs or gallops.  No Carotid bruits, radial pulse intact. No pedal edema.  Respiratory: Clear to auscultation bilaterally.  No wheezes, rales, or rhonchi.  No cyanosis, no use of accessory musculature GI: No organomegaly, abdomen is soft and non-tender, positive bowel sounds.  No masses. Skin: No rashes. + thyroidectomy scar, healing well.  Neurologic: Facial musculature symmetric. Psychiatric: Patient is appropriate throughout our interaction. Lymphatic: No cervical lymphadenopathy Musculoskeletal: Gait intact. Neck exam-decrease ROM due to pain, + spurling Shoulder exam-full ROM, tender along trapezius, there is more swelling along trap and  She is also tender scapula Neg for NEers/Hawkin, 5/5 strength   LABS: Results for orders placed in visit on 02/16/14  COMPLETE METABOLIC PANEL WITH GFR      Result Value Ref  Range   Sodium 137  135 - 145 mEq/L   Potassium 4.2  3.5 - 5.3 mEq/L   Chloride 99  96 - 112 mEq/L   CO2 24  19 - 32 mEq/L   Glucose, Bld 67 (*) 70 - 99 mg/dL   BUN 7  6 - 23 mg/dL   Creat 0.47 (*) 0.50 - 1.10 mg/dL   Total Bilirubin 0.4  0.2 - 1.2 mg/dL   Alkaline Phosphatase 54  39 - 117 U/L   AST 18  0 - 37  U/L   ALT 9  0 - 35 U/L   Total Protein 7.9  6.0 - 8.3 g/dL   Albumin 4.8  3.5 - 5.2 g/dL   Calcium 8.7  8.4 - 10.5 mg/dL   GFR, Est African American >89     GFR, Est Non African American >89    POCT GLYCOSYLATED HEMOGLOBIN (HGB A1C)      Result Value Ref Range   Hemoglobin A1C 5.2       EKG/XRAY:   Primary read interpreted by Dr. Marin Comment at [facility]. + DJD, s/p thyroidectomy, loss of normal c spine    ASSESSMENT/PLAN: Encounter Diagnoses  Name Primary?  . Neck pain   . Paresthesia Yes  . History of thyroid cancer   . Pain in joint, shoulder region, right   . Degenerative disc disease, cervical   . Screening for diabetes mellitus   . Essential hypertension    This is a pleasant 43 year old female who is POD #11 s/p   thyroidectomy with minimal lymphnode dissection for papillary thyroid carcinoma on 02/05/14 by Dr Harlow Asa  who is here for similar sxs of cervical radiculopathy she had prior to surgery  but since surgery her pain seems to be worse. C spine xray shows moderate to  significant  DJD  With loss of normal c spine curvature without any soft tissue swelling. She actually just wants to know why she has this , I am not sure if she is interested in medicines at this time since she is a little bit overwhelmed by her recent dx of thyroid cancer and the surgery. She is scheduled for radioactive iodine soon.   She also would like her labs checked specifically her Calcium level  Since she is on high doses of it.  She also has had a hx of elveaed BP without dx of HTN, At home  BP has been high >150/90s on many occassions. Please call daughter with labs reuslts and plan for BP   Meds. [phone]. HTN meds needs to be discussed once I get labs back.   F/u prn   Gross sideeffects, risk and benefits, and alternatives of medications d/w patient. Patient is aware that all medications have potential sideeffects and we are unable to predict every sideeffect or drug-drug interaction that may occur.  Anja Neuzil, [place], DO 02/19/2014 12:33 PM  02/19/13 LM about labs on 2 different numbers, daughter's and mom, had to LM  On voicemail. Everything is normal except sugar was slightly low, she had not eaten that day. Will start her on small dose of HCTZ 12.5 mg and return in 33month

## 2014-02-16 NOTE — Patient Instructions (Signed)
Hypertension Hypertension is another name for high blood pressure. High blood pressure forces your heart to work harder to pump blood. A blood pressure reading has two numbers, which includes a higher number over a lower number (example: 110/72). HOME CARE   Have your blood pressure rechecked by your doctor.  Only take medicine as told by your doctor. Follow the directions carefully. The medicine does not work as well if you skip doses. Skipping doses also puts you at risk for problems.  Do not smoke.  Monitor your blood pressure at home as told by your doctor. GET HELP IF:  You think you are having a reaction to the medicine you are taking.  You have repeat headaches or feel dizzy.  You have puffiness (swelling) in your ankles.  You have trouble with your vision. GET HELP RIGHT AWAY IF:   You get a very bad headache and are confused.  You feel weak, numb, or faint.  You get chest or belly (abdominal) pain.  You throw up (vomit).  You cannot breathe very well. MAKE SURE YOU:   Understand these instructions.  Will watch your condition.  Will get help right away if you are not doing well or get worse. Document Released: 12/13/2007 Document Revised: 07/01/2013 Document Reviewed: 04/18/2013 ExitCare Patient Information 2015 [employer]. This information is not intended to replace advice given to you by your health care provider. Make sure you discuss any questions you have with your health care provider.  

## 2014-02-16 NOTE — Telephone Encounter (Signed)
Pt s/p total thyroidectomy on 02/05/14 with Dr Harlow Asa. Pts daughter is calling today to inform Dr Harlow Asa that her mother has started having worsen pain, twitching and numbless in her right shoulder. Pt states that this was going on before her sx but this has gotten worse. Pt denies any tingling in her fingertips states its mostly shoulder area Pt states that her b/p at this time was 147/111. Advised pt that she needs to see her PCP and have them draw a Ca level. If there is any concerns to give our office a call back. Daughter verbalized understanding.

## 2014-02-17 LAB — COMPLETE METABOLIC PANEL WITH GFR
ALT: 9 U/L (ref 0–35)
Albumin: 4.8 g/dL (ref 3.5–5.2)
BUN: 7 mg/dL (ref 6–23)
CO2: 24 mEq/L (ref 19–32)
Calcium: 8.7 mg/dL (ref 8.4–10.5)
Chloride: 99 mEq/L (ref 96–112)
Creat: 0.47 mg/dL — ABNORMAL LOW (ref 0.50–1.10)
GFR, Est African American: >89 mL/min
Glucose, Bld: 67 mg/dL — ABNORMAL LOW (ref 70–99)
Potassium: 4.2 mEq/L (ref 3.5–5.3)

## 2014-02-17 LAB — COMPLETE METABOLIC PANEL WITHOUT GFR
AST: 18 U/L (ref 0–37)
Alkaline Phosphatase: 54 U/L (ref 39–117)
GFR, Est Non African American: >89 mL/min
Sodium: 137 meq/L (ref 135–145)
Total Bilirubin: 0.4 mg/dL (ref 0.2–1.2)
Total Protein: 7.9 g/dL (ref 6.0–8.3)

## 2014-02-18 ENCOUNTER — Telehealth: Payer: Self-pay

## 2014-02-18 NOTE — Telephone Encounter (Signed)
Patient's daughter called back. Advised her of Sara's message.

## 2014-02-18 NOTE — Telephone Encounter (Signed)
Patient's daughter called to see whether or not lab results came in. Daughter states the doctor told patient that she would receive a call back. CB# [phone]

## 2014-02-18 NOTE — Telephone Encounter (Signed)
Labs are still pending.  Pt advised we would call her when reviewed.

## 2014-02-19 ENCOUNTER — Encounter: Payer: Self-pay | Admitting: Family Medicine

## 2014-02-19 MED ORDER — HYDROCHLOROTHIAZIDE 12.5 MG PO CAPS: 12.5000 mg | ORAL_CAPSULE | Freq: Every day | ORAL | Status: DC

## 2014-02-20 ENCOUNTER — Telehealth (INDEPENDENT_AMBULATORY_CARE_PROVIDER_SITE_OTHER): Payer: Self-pay

## 2014-02-20 NOTE — Telephone Encounter (Signed)
Pt daughter called to make sure she can take the blood pressure medicine she was just prescribed along with the synthroid. Advised it was ok.

## 2014-02-23 ENCOUNTER — Telehealth (INDEPENDENT_AMBULATORY_CARE_PROVIDER_SITE_OTHER): Payer: Self-pay | Admitting: *Deleted

## 2014-02-23 NOTE — Telephone Encounter (Signed)
Pt's daughter called in regarding pt's order for a Calcium level check.  She advised that pt just had one done at her PCP 02-16-14 and was wondering if Dr. Harlow Asa could use that or does pt need to have the ones today that Jenny Reichmann put in?  I went to Dr. Harlow Asa and he advised that he could use the one from 02-16-14 and that pt did not have to have her Calcium levels redrawn.  Pt's daughter was advised and she verbalized understanding.  Michelle Hammond

## 2014-02-25 ENCOUNTER — Encounter (INDEPENDENT_AMBULATORY_CARE_PROVIDER_SITE_OTHER): Payer: Self-pay | Admitting: Surgery

## 2014-02-25 ENCOUNTER — Ambulatory Visit (INDEPENDENT_AMBULATORY_CARE_PROVIDER_SITE_OTHER): Payer: BC Managed Care – PPO | Admitting: Surgery

## 2014-02-25 VITALS — BP 150/86 | HR 97 | Temp 98.7°F | Ht 61.0 in | Wt 140.0 lb

## 2014-02-25 DIAGNOSIS — C73 Malignant neoplasm of thyroid gland: Secondary | ICD-10-CM

## 2014-02-25 NOTE — Progress Notes (Signed)
General Surgery [facility]  Chief Complaint  Patient presents with  . Routine Post Op    total thyroidectomy for papillary carcinoma on 02/05/2014    HISTORY: The patient is a 43 year old female who underwent total thyroidectomy with limited lymph node dissection for papillary thyroid carcinoma on 02/05/2014. Final pathology shows multifocal papillary thyroid carcinoma with the largest tumor measuring 1.4 cm, a second site measuring 3 mm, and a third site measuring 1 mm. Tumor did extend to near the surgical margin. One lymph node was negative for metastatic disease.  Postoperatively the patient has complained of neck pain radiating into the right shoulder and down the right arm. She was seen by her primary care physician and plain x-rays of the cervical spine were obtained. Patient continues to have some symptoms.  Postoperative calcium levels ever main normal at 9.3 and 8.7.  EXAM: Surgical incision is healing very nicely with a good cosmetic result. Mild soft tissue swelling. Voice quality is normal. No sign of infection.  IMPRESSION: Papillary thyroid carcinoma, multifocal  PLAN: Patient is scheduled to see her endocrinologist next week. She will require radioactive iodine treatment. This will be associated with a total body scan. Patient is currently taking Synthroid 88 mcg daily but will likely need a higher dose.  Patient may decrease her calcium supplements to once daily.  Patient will see her primary care physician regarding neck pain and radiculopathy into the right arm. She may need an MRI scan of the cervical spine and possibly referral to neurosurgery for further evaluation and recommendations.  Patient will return for wound check in 6 weeks.  Earnstine Regal, MD, [facility]   Visit Diagnoses: 1. Papillary thyroid carcinoma

## 2014-02-25 NOTE — Patient Instructions (Signed)
  CARE OF INCISION   Apply cocoa butter/vitamin E cream (Palmer's brand) to your incision 2 - 3 times daily.  Massage cream into incision for one minute with each application.  Use sunscreen (50 SPF or higher) for first 6 months after surgery if area is exposed to sun.  You may alternate Mederma or other scar reducing cream with cocoa butter cream if desired.       Wende Longstreth M. Marites Nath, MD, FACS      [facility]      Office: [phone]    

## 2014-03-02 ENCOUNTER — Telehealth: Payer: Self-pay

## 2014-03-02 ENCOUNTER — Ambulatory Visit (INDEPENDENT_AMBULATORY_CARE_PROVIDER_SITE_OTHER): Payer: BC Managed Care – PPO | Admitting: Internal Medicine

## 2014-03-02 ENCOUNTER — Encounter: Payer: Self-pay | Admitting: Internal Medicine

## 2014-03-02 VITALS — BP 133/88 | HR 98 | Temp 98.2°F | Resp 12 | Wt 139.0 lb

## 2014-03-02 DIAGNOSIS — M5010 Cervical disc disorder with radiculopathy, unspecified cervical region: Secondary | ICD-10-CM

## 2014-03-02 DIAGNOSIS — E890 Postprocedural hypothyroidism: Secondary | ICD-10-CM

## 2014-03-02 DIAGNOSIS — C73 Malignant neoplasm of thyroid gland: Secondary | ICD-10-CM

## 2014-03-02 LAB — T4, FREE: FREE T4: 0.9 ng/dL (ref 0.60–1.60)

## 2014-03-02 LAB — TSH: TSH: 0.57 u[IU]/mL (ref 0.35–4.50)

## 2014-03-02 NOTE — Telephone Encounter (Signed)
Please advise 

## 2014-03-02 NOTE — Patient Instructions (Signed)
Please come back 1 month after the RAI treatment (in ~ 2-3 mo from now). Please stop at the lab.

## 2014-03-02 NOTE — Telephone Encounter (Signed)
Refer to neurosurgery. This has been a chronic problem , slightly worse after thyroid ca surgery, She is going to have radioactive iodine soon. Please call patient's daughter  that I have put in a referral to Neurosurgery. I have also ordered an MRI for her of the cervical spine. IT will take time for referral to get through.

## 2014-03-02 NOTE — Telephone Encounter (Signed)
LE - Pt saw you for her neck, xrays were taken and Dr. Harlow Asa said that she needs an MRI.  Have we received anything on this?  Can we refer her for an MRI? [phone]

## 2014-03-02 NOTE — Progress Notes (Signed)
Patient ID: Michelle Hammond, female   DOB: 1971/02/07, 42 y.o.   MRN: [id number]   HPI  Michelle Hammond is a 43 y.o.-year-old female, returning for f/u for papillary thyroid cancer. She is here with her daughter and the interpreter.  Reviewed hx: Pt. has been found to have 2 L thyroid nodules (one of 7 x 10 mm and one of 13 x 14 mm, no calcifications) on thyroid U/S 11/14/2013 in [place] >> had a thyroid nodule FNA on 11/30/2013 (unclear of which of the 2 nodules) >> papillary thyroid carcinoma. She brings records but all are in [place]. The interpreter translated the reports for [place]. The word carcinoma is easily distinguishable on the report.  02/05/2014: total thyroidectomy by Dr Harlow Asa  Diagnosis 1. Thyroid, thyroidectomy - PAPILLARY THYROID CARCINOMA, THREE FOCI, 1.4, 0.3, AND 0.1 CM. - FOCAL VASCULAR INVOLVEMENT BY TUMOR. - FOCAL EXTRA THYROID EXTENSION. - MARGINS NOT INVOLVED. - SEE ONCOLOGY TABLE. 2. Lymph node, biopsy, central compartment - ONE BENIGN LYMPH NODE (0/1). Microscopic Comment 1. THYROID Specimen: Thyroid and central compartment lymph node. Procedure: Thyroidectomy and lymph node biopsy. Specimen Integrity (intact/fragmented): Focally disrupted. Tumor focality: Three foci of tumor, left lobe 1.4 cm, right lobe, 0.3 cm and 0.1 cm..  Dominant tumor: Left superior thyroid. Maximum tumor size (cm): 1.4 cm. Tumor laterality: Left. Histologic type (including subtype and/or unique features as applicable): Papillary carcinoma. Tumor capsule: N/A Extrathyroidal extension: Present. Margins: Free of tumor, invasive tumor focally less than 0.1 cm from inked margin. Lymph - Vascular invasion: Present, focal.  Second and additional tumors: Right lobe Tumor size(s): 0.3 and 0.1 cm Tumor laterality: Right. Histologic type (including subtype and/or unique features as applicable) : Papillary thyroid carcinoma. Tumor capsule: N/A Extrathyroidal extension: No. Margins: Free of  tumor. Lymph - Vascular invasion: No. Lymph nodes: # examined 1; # positive; 0 TNM code: pT3 , pN0  She was started on Synthroid 88 mcg daily after the surgery.  I reviewed pt's thyroid tests: Lab Results  Component Value Date   TSH 0.47 12/16/2013   TSH 0.636 04/08/2010   FREET4 0.71 12/16/2013   FREET4 1.04 04/08/2010  2012: TSH 0.628  Pt describes: - fatigue - + hot flushes - no palpitations - + tremors - no weight gain - + constipation/+ diarrhea  - + dry skin - + hair falling - no depression  Pt's thyroidectomy scar sterted itching after she applied cocoa butter to it. She stopped it 2 days ago and feels better today.  I reviewed pt's medications, allergies, PMH, social hx, family hx and no changes required, except as mentioned above. She also started HCTZ since last visit.  ROS: Constitutional: no weight gain/loss, + fatigue, no subjective hyperthermia/hypothermia, + excess urination Eyes: + blurry vision, no xerophthalmia ENT:+  sore throat, no nodules palpated in throat, no dysphagia/odynophagia, no hoarseness Cardiovascular: no CP/SOB/palpitations/leg swelling Respiratory: + cough/+ SOB Gastrointestinal: no N/V/D/C, + acid reflux Musculoskeletal: + both muscle/joint aches (+++ upper back pain) Skin:+ rash and itching on neck after applying cocoa butter; itching >> improved after stopping cocoa butter 2 days ago Neurological: no tremors/numbness/tingling/dizziness, + HA  PE: BP 133/88  Pulse 98  Temp(Src) 98.2 F (36.8 C) (Oral)  Resp 12  Wt 139 lb (63.05 kg)  SpO2 98%  LMP 02/09/2014 Wt Readings from Last 3 Encounters:  03/02/14 139 lb (63.05 kg)  02/25/14 140 lb (63.504 kg)  02/16/14 137 lb (62.143 kg)   Constitutional: overweight, in NAD Eyes: PERRLA, EOMI, no  exophthalmos ENT: moist mucous membranes, no tyroid masses - thyroidectomy scan healing well but she has a rash on a large area around the scar; no cervical lymphadenopathy Cardiovascular: RRR, No  MRG Respiratory: CTA B Gastrointestinal: abdomen soft, NT, ND, BS+ Musculoskeletal: no deformities, strength intact in all 4 Skin: moist, warm, no rashes Neurological: no tremor with outstretched hands, DTR normal in all 4  ASSESSMENT: 1. Papillary thyroid cancer  PLAN:  1. PTC - I had a long discussion with the patient, her daughter, and the [place] interpreter about her recent diagnosis of papillary thyroid cancer, reviewed the pathology, underlined multifocality and the fact that the L lobe cancer focus spread to the lymph vessels, discussed prognosis, further treatment with RAI, TSH goals (close to the LLN in the first year, then a little more lax).  - She tolerated well her recent total thyroidectomy but she developed an allergic reaction - will check thyroid tests today: TSH, free T4 - I will then need to contact [place] physician to check for the RAI tx dose needed and then schedule her RAI tx and post RAI tx WBS appts - we discussed about the need for a low iodine diet - we discussed about Rx precautions after RAI tx - we discussed that we will need to have her off the Synthroid for ~3 weeks for the RAi tx - I advised her to let the rash heal for few days, then try Mederma cream on scar  Office Visit on 03/02/2014  Component Date Value Ref Range Status  . TSH 03/02/2014 0.57  0.35 - 4.50 uIU/mL Final  . Free T4 03/02/2014 0.90  0.60 - 1.60 ng/dL Final   Msg sent: Dear Michelle Hammond, The thyroid tests are actually very good (TSH is close to the lower limit of normal), so please continue with the current Synthroid dose until we stop it for the radioactive iodine treatment preparation. Sincerely, Michelle Hammond  I called and discuss the case with Dr Leonia Reeves ([place]) >> he recommended no pretx scan and to tx with 100 mCi of I131. Orders placed.  Schedule: 03/05/2014: Stop Synthroid and start low iodine diet 03/20/2014: come to our lab for a TSH level check - If this is mildly  high, but <30, we might need to continue the low iodine diet and staying off the Synthroid for another week - if this is high (>30), we will go ahead with the following appointments: 03/25/2014 at 12:30 pm: go to [facility] for the RAI treatment 03/28/2014: Restart Synthroid and stop low iodine diet 04/01/2014 at 8 am: go to [facility] [place] for the post-treatment Whole Body Scan 04/27/2014 at 10 am: come for an appointment with me

## 2014-03-03 ENCOUNTER — Telehealth: Payer: Self-pay

## 2014-03-03 NOTE — Telephone Encounter (Signed)
LM for pt that we are working on referral to Neuro. Rtn call if anything else is needed.

## 2014-03-03 NOTE — Telephone Encounter (Addendum)
Please call united health care at [phone]  To give more clinical notes for this patient

## 2014-03-04 ENCOUNTER — Ambulatory Visit (HOSPITAL_COMMUNITY): Payer: BC Managed Care – PPO

## 2014-03-04 NOTE — Telephone Encounter (Signed)
Called to give more clinical information- Helema states that another clinical reviewer is currently working on getting this authorized and to check the website in a little bit to get the prior auth #

## 2014-03-05 ENCOUNTER — Encounter: Payer: Self-pay | Admitting: Internal Medicine

## 2014-03-06 NOTE — Telephone Encounter (Signed)
Insurance is still pending 

## 2014-03-20 ENCOUNTER — Other Ambulatory Visit (INDEPENDENT_AMBULATORY_CARE_PROVIDER_SITE_OTHER): Payer: BC Managed Care – PPO

## 2014-03-20 DIAGNOSIS — C73 Malignant neoplasm of thyroid gland: Secondary | ICD-10-CM

## 2014-03-20 LAB — TSH: TSH: 4.24 u[IU]/mL (ref 0.35–4.50)

## 2014-03-23 NOTE — Telephone Encounter (Signed)
Patient would like to know the procedure code for the radioactive treatment she is going to have.

## 2014-03-25 ENCOUNTER — Encounter (HOSPITAL_COMMUNITY): Admission: RE | Admit: 2014-03-25 | Payer: BC Managed Care – PPO | Source: Ambulatory Visit

## 2014-03-28 ENCOUNTER — Ambulatory Visit
Admission: RE | Admit: 2014-03-28 | Discharge: 2014-03-28 | Disposition: A | Discharge status: Home / Self Care | Payer: BC Managed Care – PPO | Source: Ambulatory Visit | Attending: Family Medicine | Admitting: Family Medicine

## 2014-03-28 DIAGNOSIS — M5010 Cervical disc disorder with radiculopathy, unspecified cervical region: Secondary | ICD-10-CM

## 2014-04-01 ENCOUNTER — Encounter (HOSPITAL_COMMUNITY): Payer: BC Managed Care – PPO

## 2014-04-02 ENCOUNTER — Other Ambulatory Visit (INDEPENDENT_AMBULATORY_CARE_PROVIDER_SITE_OTHER): Payer: BC Managed Care – PPO

## 2014-04-02 ENCOUNTER — Other Ambulatory Visit: Payer: Self-pay | Admitting: *Deleted

## 2014-04-02 DIAGNOSIS — E890 Postprocedural hypothyroidism: Secondary | ICD-10-CM

## 2014-04-02 LAB — TSH: TSH: 12.45 u[IU]/mL — AB (ref 0.35–4.50)

## 2014-04-03 ENCOUNTER — Other Ambulatory Visit: Payer: Self-pay | Admitting: Internal Medicine

## 2014-04-03 ENCOUNTER — Other Ambulatory Visit: Payer: Self-pay | Admitting: Family Medicine

## 2014-04-03 ENCOUNTER — Telehealth: Payer: Self-pay | Admitting: Radiology

## 2014-04-03 ENCOUNTER — Other Ambulatory Visit: Payer: BC Managed Care – PPO

## 2014-04-03 DIAGNOSIS — M5010 Cervical disc disorder with radiculopathy, unspecified cervical region: Secondary | ICD-10-CM

## 2014-04-03 DIAGNOSIS — E890 Postprocedural hypothyroidism: Secondary | ICD-10-CM

## 2014-04-03 NOTE — Telephone Encounter (Signed)
I called Neurosurgeon office to see how soon we can get patient in with the Neurosurgeon, left message with new patient co ordinator. Does the patient have to see Dr Arnoldo Morale, or can she see another surgeon? Dr Arnoldo Morale schedule is VERY booked, but another surgeon may be able to get her in sooner.

## 2014-04-06 ENCOUNTER — Encounter (HOSPITAL_COMMUNITY): Payer: BC Managed Care – PPO

## 2014-04-06 NOTE — Telephone Encounter (Signed)
I spoke to Michelle Hammond in referrals, and she will work on this and see if she can continue to try to get patient in quickly.

## 2014-04-08 ENCOUNTER — Telehealth: Payer: Self-pay | Admitting: Internal Medicine

## 2014-04-08 ENCOUNTER — Encounter (INDEPENDENT_AMBULATORY_CARE_PROVIDER_SITE_OTHER): Payer: BC Managed Care – PPO | Admitting: Surgery

## 2014-04-08 NOTE — Telephone Encounter (Signed)
I will talk to her - let's bring her in.

## 2014-04-08 NOTE — Telephone Encounter (Signed)
Called [facility] and rescheduled pt's appt for tx and WBS. RAI tx Mon, Oct 12th at 1:00 pm (12:45 arrival time). WBS Mon, Oct 19th at 8:00 am (7:45 am). Called pt's daughter, Sharyn Lull and lvm advising her of the schedule change. Advised to call with any questions. Be advised.

## 2014-04-08 NOTE — Telephone Encounter (Signed)
Pt is in the lobby. Pt would like to speak briefly with Larene Beach regarding the iodine diet it is making her have no energy at all

## 2014-04-08 NOTE — Telephone Encounter (Signed)
I advised pt that I would have to speak with Dr Cruzita Lederer and then call her. Please read note below and advise.

## 2014-04-08 NOTE — Telephone Encounter (Signed)
Patients daughter called and would like to confirm if Mrs. Michelle Hammond treatment had been canceled Per patient she was not doing the Hammond but its still on her mychart  Please advise    Thank you

## 2014-04-10 ENCOUNTER — Other Ambulatory Visit (INDEPENDENT_AMBULATORY_CARE_PROVIDER_SITE_OTHER): Payer: BC Managed Care – PPO

## 2014-04-10 DIAGNOSIS — E890 Postprocedural hypothyroidism: Secondary | ICD-10-CM

## 2014-04-10 LAB — TSH: TSH: 50.82 u[IU]/mL — AB (ref 0.35–4.50)

## 2014-04-13 ENCOUNTER — Encounter (HOSPITAL_COMMUNITY): Payer: BC Managed Care – PPO

## 2014-04-13 ENCOUNTER — Telehealth: Payer: Self-pay | Admitting: Internal Medicine

## 2014-04-13 ENCOUNTER — Encounter (HOSPITAL_COMMUNITY)
Admission: RE | Admit: 2014-04-13 | Discharge: 2014-04-13 | Disposition: A | Discharge status: Home / Self Care | Payer: BC Managed Care – PPO | Source: Ambulatory Visit | Attending: Internal Medicine | Admitting: Internal Medicine

## 2014-04-13 DIAGNOSIS — C73 Malignant neoplasm of thyroid gland: Secondary | ICD-10-CM | POA: Diagnosis present

## 2014-04-13 LAB — HCG, SERUM, QUALITATIVE: Preg, Serum: NEGATIVE

## 2014-04-13 MED ORDER — SODIUM IODIDE I 131 CAPSULE
103.4000 | Freq: Once | INTRAVENOUS | Status: AC | PRN
  Administered 2014-04-13: 103.4 via ORAL

## 2014-04-13 NOTE — Telephone Encounter (Signed)
Called pt back and spoke with pt's daughter. Advised can resume normal diet. Start back on thyroid medication in 3 days. She understood and will advise her mother.

## 2014-04-13 NOTE — Telephone Encounter (Signed)
Patient had her RAI treatment today and wants to know when she can go back to her normal diet  Also wants to know when to start taking her thyroid medicine   Please advise    Thank you

## 2014-04-20 ENCOUNTER — Ambulatory Visit (HOSPITAL_COMMUNITY): Payer: BC Managed Care – PPO

## 2014-04-20 ENCOUNTER — Ambulatory Visit (INDEPENDENT_AMBULATORY_CARE_PROVIDER_SITE_OTHER): Payer: BC Managed Care – PPO | Admitting: Family Medicine

## 2014-04-20 ENCOUNTER — Encounter (HOSPITAL_COMMUNITY)
Admission: RE | Admit: 2014-04-20 | Discharge: 2014-04-20 | Disposition: A | Discharge status: Home / Self Care | Payer: BC Managed Care – PPO | Source: Ambulatory Visit | Attending: Internal Medicine | Admitting: Internal Medicine

## 2014-04-20 ENCOUNTER — Encounter (HOSPITAL_COMMUNITY): Payer: BC Managed Care – PPO

## 2014-04-20 VITALS — BP 142/94 | HR 96 | Temp 99.0°F | Resp 17 | Ht 61.5 in | Wt 141.0 lb

## 2014-04-20 DIAGNOSIS — Z8619 Personal history of other infectious and parasitic diseases: Secondary | ICD-10-CM

## 2014-04-20 DIAGNOSIS — K2100 Gastro-esophageal reflux disease with esophagitis, without bleeding: Secondary | ICD-10-CM

## 2014-04-20 DIAGNOSIS — C73 Malignant neoplasm of thyroid gland: Secondary | ICD-10-CM | POA: Insufficient documentation

## 2014-04-20 DIAGNOSIS — B169 Acute hepatitis B without delta-agent and without hepatic coma: Secondary | ICD-10-CM

## 2014-04-20 DIAGNOSIS — I10 Essential (primary) hypertension: Secondary | ICD-10-CM

## 2014-04-20 DIAGNOSIS — B1910 Unspecified viral hepatitis B without hepatic coma: Secondary | ICD-10-CM

## 2014-04-20 LAB — COMPLETE METABOLIC PANEL WITHOUT GFR
AST: 24 U/L (ref 0–37)
Alkaline Phosphatase: 49 U/L (ref 39–117)
Creat: 0.67 mg/dL (ref 0.50–1.10)
GFR, Est Non African American: >89 mL/min
Glucose, Bld: 79 mg/dL (ref 70–99)
Total Bilirubin: 0.3 mg/dL (ref 0.2–1.2)

## 2014-04-20 LAB — COMPLETE METABOLIC PANEL WITH GFR
ALT: 14 U/L (ref 0–35)
Albumin: 4.2 g/dL (ref 3.5–5.2)
BUN: 4 mg/dL — ABNORMAL LOW (ref 6–23)
CO2: 28 mEq/L (ref 19–32)
Calcium: 8.1 mg/dL — ABNORMAL LOW (ref 8.4–10.5)
Chloride: 103 mEq/L (ref 96–112)
GFR, Est African American: >89 mL/min
Potassium: 3.4 mEq/L — ABNORMAL LOW (ref 3.5–5.3)
Sodium: 140 mEq/L (ref 135–145)
Total Protein: 7.1 g/dL (ref 6.0–8.3)

## 2014-04-20 MED ORDER — HYDROCHLOROTHIAZIDE 12.5 MG PO CAPS: 12.5000 mg | ORAL_CAPSULE | Freq: Every day | ORAL | Status: DC

## 2014-04-20 NOTE — Progress Notes (Signed)
 Chief Complaint:  Chief Complaint  Patient presents with  . Hypertension  . Gastrophageal Reflux    HPI: Michelle Hammond is a 43 y.o. female who is here for 1. GERD sxs-she has worsening sxs , she has a hsitory of gastritis with H pylori in the past, [facility] has seen her for this, she states that her GERD sxs have gotten worse since she had active radioiodine for her thryoid cancer. She just received RAI today prior to seeing me.  She has a PMH of thyroid cancer s/p resection  2. She has HTN. Wants to know    Past Medical History  Diagnosis Date  . Hepatitis B   . Thyroid disease     SOMETIMES FAST HEART BEAT, MUSCLES TINGLE, SOMETIMES HARD TO SWALLOW,  AND FEELS LIKE NEEDS TO CLEAR THROAT  . Allergy     SEASONAL   . Cancer     Thyroid cancer   . Numbness     WHOLE RIGHT SIDE OF BODY FEELS NUMB AND PAINFUL,  AND PAIN LEFT SIDE OF BODY PAINFUL - BUT NO NUMBNESS LEFT SIDE - STATES SHE HAS HAD PROBLEM FOR YEARS- DOES NOT KNOW THE CAUSE OF HER NUMBNESS AND PAIN  . Shortness of breath   . Asthma     NO LONGER USING INHALER  . GERD (gastroesophageal reflux disease)     EVERY TIME I EAT I HAVE BURPING, INDIGESTION AND DIARRHEA  . Hemorrhoids     PAINFUL AND SOMETIMES BLEEDING  . Bruises easily    Past Surgical History  Procedure Laterality Date  . No previous surgery    . Thyroidectomy N/A 02/05/2014    Procedure: TOTAL THYROIDECTOMY ;  Surgeon: Earnstine Regal, MD;  Location: [facility];  Service: General;  Laterality: N/A;  . Lymph node dissection N/A 02/05/2014    Procedure: CENTRAL LYMPH NODE DISSECTION;  Surgeon: Earnstine Regal, MD;  Location: [facility];  Service: General;  Laterality: N/A;   History   Social History  . Marital Status: Divorced    Spouse Name: N/A    Number of Children: N/A  . Years of Education: N/A   Social History Main Topics  . Smoking status: Never Smoker   . Smokeless tobacco: Never Used  . Alcohol Use: No  . Drug Use: No  . Sexual Activity:  Not Currently    Birth Control/ Protection: None   Other Topics Concern  . None   Social History Narrative  . None   Family History  Problem Relation Age of Onset  . Hypertension Mother   . Kidney disease Father    Allergies  Allergen Reactions  . Aspirin Other (See Comments)    Easily bruising   Prior to Admission medications   Medication Sig Start Date End Date Taking? Authorizing Provider  calcium carbonate (OS-CAL - DOSED IN MG OF ELEMENTAL CALCIUM) 1250 MG tablet Take 2 tablets (1,000 mg of elemental calcium total) by mouth 2 (two) times daily with a meal. 02/06/14  Yes Armandina Gemma, MD  cetirizine (ZYRTEC) 10 MG tablet Take 10 mg by mouth daily. PT STATES HER BRAND IS ALLER-TEC   Yes Historical Provider, MD  Chlorphen-Pseudoephed-APAP (TYLENOL ALLERGY SINUS PO) Take 1 tablet by mouth daily.   Yes Historical Provider, MD  Cholecalciferol (VITAMIN D-3) 5000 UNITS TABS Take 1 tablet by mouth daily.   Yes Historical Provider, MD  glucosamine-chondroitin 500-400 MG tablet Take 1 tablet by mouth 2 (two) times daily.  Yes Historical Provider, MD  hydrochlorothiazide (MICROZIDE) 12.5 MG capsule Take 1 capsule (12.5 mg total) by mouth daily. Need follow-uo in 1 month for repeat labs 02/19/14  Yes  P , DO  HYDROcodone-acetaminophen (NORCO/VICODIN) 5-325 MG per tablet Take 1-2 tablets by mouth every 4 (four) hours as needed for moderate pain. 02/06/14  Yes Armandina Gemma, MD  Multiple Vitamin (MULTIVITAMIN WITH MINERALS) TABS tablet Take 1 tablet by mouth daily.   Yes Historical Provider, MD  omeprazole (PRILOSEC) 40 MG capsule Take 1 capsule (40 mg total) by mouth daily. 02/12/14  Yes Shawnee Knapp, MD  OVER THE COUNTER MEDICATION PREPARATION H SUPPOSITORY  ONCE A DAY AT NIGHT   Yes Historical Provider, MD  SYNTHROID 88 MCG tablet Take 1 tablet (88 mcg total) by mouth daily before breakfast. 02/06/14  Yes Armandina Gemma, MD     ROS: The patient denies fevers, chills, night sweats, unintentional  weight loss, chest pain, palpitations, wheezing, dyspnea on exertion, nausea, vomiting, abdominal pain, dysuria, hematuria, melena, + chronic numbness, weakness, or tingling.   All other systems have been reviewed and were otherwise negative with the exception of those mentioned in the HPI and as above.    PHYSICAL EXAM: Filed Vitals:   04/20/14 1020  BP: 142/94  Pulse: 96  Temp: 99 F (37.2 C)  Resp: 17   Filed Vitals:   04/20/14 1020  Height: 5' 1.5" (1.562 m)  Weight: 141 lb (63.957 kg)   Body mass index is 26.21 kg/(m^2).  General: Alert, no acute distress, she is anxious  HEENT:  Normocephalic, atraumatic, oropharynx patent. EOMI, PERRLA Cardiovascular:  Regular rate and rhythm, no rubs murmurs or gallops.  No Carotid bruits, radial pulse intact. No pedal edema.  Respiratory: Clear to auscultation bilaterally.  No wheezes, rales, or rhonchi.  No cyanosis, no use of accessory musculature GI: No organomegaly, abdomen is soft and non-tender, positive bowel sounds.  No masses. Skin: No rashes. + thryoidectomy wound, looks well healed.  Neurologic: Facial musculature symmetric. Psychiatric: Patient is appropriate throughout our interaction. Lymphatic: No cervical lymphadenopathy Musculoskeletal: Gait intact.   LABS: Results for orders placed during the hospital encounter of 04/13/14  HCG, SERUM, QUALITATIVE      Result Value Ref Range   Preg, Serum NEGATIVE  NEGATIVE     EKG/XRAY:   Primary read interpreted by Dr. Marin Comment at [facility].   ASSESSMENT/PLAN: Encounter Diagnoses  Name Primary?  . Gastroesophageal reflux disease with esophagitis Yes  . Essential hypertension   . Hepatitis B infection without delta agent without hepatic coma, unspecified chronicity   . History of Helicobacter pylori infection    43 year old [place] female with recent dx of thyroid cancer s/p thyroidectomy and RAI, GERD with gastristis and Hpylori, Hepatitis B and also HTN who is here for:  1.  Worsenign sxs of GERD s/p RAI- will try a 1 week Nexium trial ,if works then will refill for GERD.  2. Hep B, needs referral to hepatology clinic based on prior noted form [facility]. She apparently had seen one many years ago and they told her to follow-up but she has not been abck and she doe snot know wh she saw, she does not know if she is a carrier or is she has chronic Hep B 3. Continue with current BP med, no SEs, she is doing fine  4. Neuropathy unchanged-she has a neurology referral pending 5. 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 Her daughter pays for her health insurance currently and she is worried that when the year is up her insurance will change and she cannot afford it. She has not worked for many months now and applied for [employer] and because she makes too much money  They would not give her mediaid. She currently utilizes the Affordable Care Act exchange. She also has a lot of stressors at home in [place] as well, her father is sick. Interstingly she was uninsured for a long time and had the orange card and was feeling poorly so went back to [place] to visit her sick father and also saw a doctor in 11/2013 there and that was when she was dx with thyroid cancer.  I am not sure if she wants to be on medications for anxiety/depressiona  this time. I will d/w her again when we get her lab results, see how her GERD sxs are faring and also how she is feeling after her RAI.  6. H/o asthma-no current flare ups 7. Labs pedning:CMP and H pylori pending  F/u prn, otherwise 6 months  Gross sideeffects, risk and benefits, and alternatives of medications d/w patient. Patient is aware that all medications have potential sideeffects and we are unable to predict every  sideeffect or drug-drug interaction that may occur.  , [place], DO 04/20/2014 11:39 AM

## 2014-04-21 ENCOUNTER — Other Ambulatory Visit: Payer: Self-pay | Admitting: Family Medicine

## 2014-04-21 DIAGNOSIS — E876 Hypokalemia: Secondary | ICD-10-CM

## 2014-04-21 MED ORDER — POTASSIUM CHLORIDE CRYS ER 20 MEQ PO TBCR
20.0000 meq | EXTENDED_RELEASE_TABLET | Freq: Every day | ORAL | Status: DC
Start: 2014-04-21 — End: 2014-06-11

## 2014-04-22 LAB — HELICOBACTER PYLORI  ANTIBODY, IGM: Helicobacter pylori, IgM: 1 U/mL (ref <9.0)

## 2014-04-27 ENCOUNTER — Telehealth: Payer: Self-pay

## 2014-04-27 ENCOUNTER — Ambulatory Visit (INDEPENDENT_AMBULATORY_CARE_PROVIDER_SITE_OTHER): Payer: BC Managed Care – PPO | Admitting: Internal Medicine

## 2014-04-27 ENCOUNTER — Encounter (HOSPITAL_COMMUNITY): Payer: BC Managed Care – PPO

## 2014-04-27 ENCOUNTER — Other Ambulatory Visit: Payer: Self-pay | Admitting: *Deleted

## 2014-04-27 ENCOUNTER — Encounter: Payer: Self-pay | Admitting: Internal Medicine

## 2014-04-27 VITALS — BP 124/80 | HR 100 | Temp 98.1°F | Resp 12 | Wt 137.0 lb

## 2014-04-27 DIAGNOSIS — C73 Malignant neoplasm of thyroid gland: Secondary | ICD-10-CM

## 2014-04-27 DIAGNOSIS — E890 Postprocedural hypothyroidism: Secondary | ICD-10-CM

## 2014-04-27 DIAGNOSIS — Z23 Encounter for immunization: Secondary | ICD-10-CM

## 2014-04-27 NOTE — Patient Instructions (Signed)
Please come back to the lab in 3 weeks. Please come back for a follow-up appointment in 3 months.  Take the thyroid hormone every day, with water, >30 minutes before breakfast, separated by >4 hours from acid reflux medications, calcium, iron, multivitamins.

## 2014-04-27 NOTE — Progress Notes (Signed)
Patient ID: Michelle Hammond, female   DOB: [birth year], 43 y.o.   MRN: [id number]   HPI  Michelle Hammond is a 43 y.o.-year-old female, returning for f/u for papillary thyroid cancer. She is here with a friend. Last visit 1.5 mo ago.  Reviewed hx: Pt. has been found to have 2 L thyroid nodules (one of 7 x 10 mm and one of 13 x 14 mm, no calcifications) on thyroid U/S 11/14/2013 in [place] >> had a thyroid nodule FNA on 11/30/2013 (unclear of which of the 2 nodules) >> papillary thyroid carcinoma. She brought records in [place]. The interpreter translated the reports for [place]. The word carcinoma is easily distinguishable on the report.  02/05/2014: total thyroidectomy by Dr Harlow Asa  Diagnosis 1. Thyroid, thyroidectomy - PAPILLARY THYROID CARCINOMA, THREE FOCI, 1.4, 0.3, AND 0.1 CM. - FOCAL VASCULAR INVOLVEMENT BY TUMOR. - FOCAL EXTRA THYROID EXTENSION. - MARGINS NOT INVOLVED. - SEE ONCOLOGY TABLE. 2. Lymph node, biopsy, central compartment - ONE BENIGN LYMPH NODE (0/1). Microscopic Comment 1. THYROID Specimen: Thyroid and central compartment lymph node. Procedure: Thyroidectomy and lymph node biopsy. Specimen Integrity (intact/fragmented): Focally disrupted. Tumor focality: Three foci of tumor, left lobe 1.4 cm, right lobe, 0.3 cm and 0.1 cm..  Dominant tumor: Left superior thyroid. Maximum tumor size (cm): 1.4 cm. Tumor laterality: Left. Histologic type (including subtype and/or unique features as applicable): Papillary carcinoma. Tumor capsule: N/A Extrathyroidal extension: Present. Margins: Free of tumor, invasive tumor focally less than 0.1 cm from inked margin. Lymph - Vascular invasion: Present, focal.  Second and additional tumors: Right lobe Tumor size(s): 0.3 and 0.1 cm Tumor laterality: Right. Histologic type (including subtype and/or unique features as applicable) : Papillary thyroid carcinoma. Tumor capsule: N/A Extrathyroidal extension: No. Margins: Free of  tumor. Lymph - Vascular invasion: No. Lymph nodes: # examined 1; # positive; 0 TNM code: pT3 , pN0 04/13/2014: RAI Tx 103 mCi I-131 04/21/2014: Post tx WBS: focal activity in thyroid bed only.  She was started on Synthroid 88 mcg daily after the surgery - this had to be stopped for ~3 weeks to allow the TSH to increase for the remnant ablation. She felt poorly during the TSH increase, and is slowly improving after restarted the LT4. Still feeling tired, poor memory, has weight gain, taste change.  She takes the LT4: - fasting - w/ water - occasionally PPI and MVI in am, 2h from LT4!  I reviewed pt's thyroid tests: Lab Results  Component Value Date   TSH 50.82* 04/10/2014   TSH 12.45* 04/02/2014   TSH 4.24 03/20/2014   TSH 0.57 03/02/2014   TSH 0.47 12/16/2013   TSH 0.636 04/08/2010   FREET4 0.90 03/02/2014   FREET4 0.71 12/16/2013   FREET4 1.04 04/08/2010  2012: TSH 0.628  Pt describes: - + fatigue - + hot flushes - no palpitations - + tremors - no weight gain - + constipation - + dry skin - + hair falling - no depression  Pt's thyroidectomy scar still tingling.  I reviewed pt's medications, allergies, PMH, social hx, family hx and no changes required, except as mentioned above.   ROS: Constitutional: + weight gain, + fatigue, no subjective hyperthermia/hypothermia Eyes: no blurry vision, no xerophthalmia ENT: no sore throat, no nodules palpated in throat, no dysphagia/odynophagia, no hoarseness Cardiovascular: no CP/SOB/palpitations/leg swelling Respiratory: no cough/no SOB Gastrointestinal: no N/V/D/C, + acid reflux Musculoskeletal: + both muscle/joint aches Skin:+ tingling in thyroid scar area Neurological: no tremors/numbness/tingling/dizziness  PE: BP 124/80  Pulse  100  Temp(Src) 98.1 F (36.7 C) (Oral)  Resp 12  Wt 137 lb (62.143 kg)  SpO2 98%  LMP 04/03/2014 Body mass index is 25.47 kg/(m^2).  Wt Readings from Last 3 Encounters:  04/27/14 137 lb (62.143  kg)  04/20/14 141 lb (63.957 kg)  03/02/14 139 lb (63.05 kg)   Constitutional: overweight, in NAD Eyes: PERRLA, EOMI, no exophthalmos ENT: moist mucous membranes, no tyroid masses - thyroidectomy scan healing well; no cervical lymphadenopathy Cardiovascular: RRR, No MRG Respiratory: CTA B Gastrointestinal: abdomen soft, NT, ND, BS+ Musculoskeletal: no deformities, strength intact in all 4 Skin: moist, warm, no rashes Neurological: no tremor with outstretched hands, DTR normal in all 4  ASSESSMENT: 1. Papillary thyroid cancer  PLAN:  1. PTC - I had a long discussion with the patient and her friend about her diagnosis of papillary thyroid cancer, reviewed RAI Tx purpose and the fact that the WBS did not show any distant and neck mets - She tolerated well her recent total thyroidectomy but still has tingling - she still has sxs of hypothyroidism as her last TSH was 50 (withdrawal) - will check thyroid tests in 3 weeks: TSH, free T4 and Tg + ATA - until then, continue LT4 88 mcg daily - again advised to take the thyroid hormone every day, with water, >30 minutes before breakfast, separated by >4 hours from acid reflux medications, calcium, iron, multivitamins. - discuss plan for the next visits, including repeating an U/S and getting a thyrogen-stim WBS in 1 year - gave flu vaccine and tetanus vaccine today - Return in about 3 months (around 07/28/2014).

## 2014-04-27 NOTE — Telephone Encounter (Signed)
Pt was seen by Dr. Marin Comment on [phone] and was given some samples of nexium and was told to call back if it worked for her so that we could prescribe it to her. She said the nexium worked great and would like a prescription. [facility] [address]

## 2014-04-28 MED ORDER — ESOMEPRAZOLE MAGNESIUM 40 MG PO CPDR: 40.0000 mg | DELAYED_RELEASE_CAPSULE | Freq: Every day | ORAL | Status: DC

## 2014-04-28 NOTE — Telephone Encounter (Signed)
Per OV refills have been sent into pharmacy.  Pt advised.

## 2014-05-11 ENCOUNTER — Encounter: Payer: Self-pay | Admitting: Internal Medicine

## 2014-05-11 ENCOUNTER — Ambulatory Visit (INDEPENDENT_AMBULATORY_CARE_PROVIDER_SITE_OTHER): Payer: BC Managed Care – PPO | Admitting: Internal Medicine

## 2014-05-11 VITALS — BP 144/96 | HR 98 | Temp 97.2°F | Wt 140.0 lb

## 2014-05-11 DIAGNOSIS — B181 Chronic viral hepatitis B without delta-agent: Secondary | ICD-10-CM | POA: Diagnosis not present

## 2014-05-11 LAB — COMPLETE METABOLIC PANEL WITH GFR
ALK PHOS: 50 U/L (ref 39–117)
ALT: 9 U/L (ref 0–35)
AST: 18 U/L (ref 0–37)
Albumin: 4.3 g/dL (ref 3.5–5.2)
BUN: 5 mg/dL — AB (ref 6–23)
CO2: 27 mEq/L (ref 19–32)
CREATININE: 0.55 mg/dL (ref 0.50–1.10)
Calcium: 8.7 mg/dL (ref 8.4–10.5)
Chloride: 103 mEq/L (ref 96–112)
GFR, Est African American: >89 mL/min
GFR, Est Non African American: >89 mL/min
Glucose, Bld: 92 mg/dL (ref 70–99)
Potassium: 3.7 mEq/L (ref 3.5–5.3)
Sodium: 136 mEq/L (ref 135–145)
Total Bilirubin: 0.5 mg/dL (ref 0.2–1.2)
Total Protein: 7.5 g/dL (ref 6.0–8.3)

## 2014-05-11 NOTE — Progress Notes (Signed)
Subjective:    Patient ID: Michelle Hammond, female    DOB: [birth year], 43 y.o.   MRN: [id number]  HPI 43yo [place] female who has been residing in the [place] for many years has been referred for follow up for chronic hepatitis B. She states that she was previously followed by GI but has not done so in several years due to lack of insurance. In regards to other viral hepatitis, she recalls having episode of hepatitis at age of 43 yo. While she was in [place]. She remembers having scleral icterus, dark urine, jaundice, and feeling fatigue that improved on its own. Unclear i she had hep A vaccination  Allergies  Allergen Reactions  . Aspirin Other (See Comments)    Easily bruising   Current Outpatient Prescriptions on File Prior to Visit  Medication Sig Dispense Refill  . calcium carbonate (OS-CAL - DOSED IN MG OF ELEMENTAL CALCIUM) 1250 MG tablet Take 2 tablets (1,000 mg of elemental calcium total) by mouth 2 (two) times daily with a meal. 60 tablet 0  . cetirizine (ZYRTEC) 10 MG tablet Take 10 mg by mouth daily. PT STATES HER BRAND IS ALLER-TEC    . Chlorphen-Pseudoephed-APAP (TYLENOL ALLERGY SINUS PO) Take 1 tablet by mouth daily.    . Cholecalciferol (VITAMIN D-3) 5000 UNITS TABS Take 1 tablet by mouth daily.    Marland Kitchen esomeprazole (NEXIUM) 40 MG capsule Take 1 capsule (40 mg total) by mouth daily. 30 capsule 3  . glucosamine-chondroitin 500-400 MG tablet Take 1 tablet by mouth 2 (two) times daily.    . hydrochlorothiazide (MICROZIDE) 12.5 MG capsule Take 1 capsule (12.5 mg total) by mouth daily. Need follow-uo in 1 month for repeat labs 30 capsule 5  . HYDROcodone-acetaminophen (NORCO/VICODIN) 5-325 MG per tablet Take 1-2 tablets by mouth every 4 (four) hours as needed for moderate pain. 20 tablet 0  . Multiple Vitamin (MULTIVITAMIN WITH MINERALS) TABS tablet Take 1 tablet by mouth daily.    Marland Kitchen omeprazole (PRILOSEC) 40 MG capsule Take 1 capsule (40 mg total) by mouth daily. 30 capsule 3  . OVER  THE COUNTER MEDICATION PREPARATION H SUPPOSITORY  ONCE A DAY AT NIGHT    . potassium chloride SA (K-DUR,KLOR-CON) 20 MEQ tablet Take 1 tablet (20 mEq total) by mouth daily. Recheck in 2 weeks your potasssium level, labs only 14 tablet 0  . SYNTHROID 88 MCG tablet Take 1 tablet (88 mcg total) by mouth daily before breakfast. 30 tablet 3   No current facility-administered medications on file prior to visit.   Active Ambulatory Problems    Diagnosis Date Noted  . 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 Bronchial asthma 04/29/2013  . Seasonal allergies 04/29/2013  . Papillary thyroid carcinoma 02/05/2014  . 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 No Resolved Ambulatory Problems   Past Medical History  Diagnosis Date  . Hepatitis B   . Thyroid disease   . Allergy   . Cancer   . Numbness   . Shortness of breath   . Asthma   . GERD (gastroesophageal reflux disease)   . Hemorrhoids   . Bruises easily    History  Substance Use Topics  . Smoking status: Never Smoker   . Smokeless tobacco: Never Used  . 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  Review of  Systems Having 2-3 bowel movements a day, irregular pattern. She notices having undigested food. She also GERD, and nausea but not often vomiting    Objective:   Physical Exam BP 144/96 mmHg  Pulse 98  Temp(Src) 97.2 F (36.2 C) (Oral)  Wt 140 lb (63.504 kg)  LMP 05/01/2014 Physical Exam  Constitutional:  oriented to person, place, and time. appears well-developed and well-nourished. No distress.  HENT:  Mouth/Throat: Oropharynx is clear and moist. No oropharyngeal exudate.  Cardiovascular: Normal rate, regular rhythm and normal heart sounds. Exam reveals no gallop and no friction rub.  No murmur heard.  Pulmonary/Chest: Effort normal and breath  sounds normal. No respiratory distress.  has no wheezes.  Abdominal: Soft. Bowel sounds are normal.  exhibits no distension. There is no tenderness.  Lymphadenopathy: no cervical adenopathy.  Neurological: alert and oriented to person, place, and time.  Skin: Skin is warm and dry. No rash noted. No erythema.  Psychiatric: a normal mood and affect.  behavior is normal.      Assessment & Plan:  Chronic hepatitis b = will check labs. Known to be hep B antigen +. Will check cmp. Based upon labs, will determine if need to start hep b treatment. Will also need to get ultrasound for [facility] surveillance  GERD = starting to use nexium, getting some relief  Papillary thyroid cancer = being followed by Dr. Cruzita Lederer

## 2014-05-12 LAB — HEPATITIS A ANTIBODY, TOTAL: HEP A TOTAL AB: REACTIVE — AB

## 2014-05-12 LAB — HIV ANTIBODY (ROUTINE TESTING W REFLEX): HIV 1&2 Ab, 4th Generation: NONREACTIVE

## 2014-05-12 LAB — HEPATITIS B CORE ANTIBODY, TOTAL: HEP B C TOTAL AB: REACTIVE — AB

## 2014-05-13 LAB — HEPATITIS B E ANTIGEN: Hepatitis Be Antigen: NONREACTIVE

## 2014-05-15 ENCOUNTER — Telehealth: Payer: Self-pay | Admitting: Internal Medicine

## 2014-05-15 ENCOUNTER — Other Ambulatory Visit (HOSPITAL_COMMUNITY): Payer: Self-pay | Admitting: Obstetrics and Gynecology

## 2014-05-15 DIAGNOSIS — Z1231 Encounter for screening mammogram for malignant neoplasm of breast: Secondary | ICD-10-CM

## 2014-05-15 NOTE — Telephone Encounter (Signed)
Pls advise.  

## 2014-05-15 NOTE — Telephone Encounter (Signed)
yes, labs are already in.

## 2014-05-15 NOTE — Telephone Encounter (Signed)
Patient would like to know if she need to come in an get labs Monday?

## 2014-05-15 NOTE — Telephone Encounter (Signed)
Attempted to call pt; left a vm for return call

## 2014-05-18 ENCOUNTER — Other Ambulatory Visit (INDEPENDENT_AMBULATORY_CARE_PROVIDER_SITE_OTHER): Payer: BC Managed Care – PPO

## 2014-05-18 DIAGNOSIS — C73 Malignant neoplasm of thyroid gland: Secondary | ICD-10-CM

## 2014-05-18 DIAGNOSIS — E890 Postprocedural hypothyroidism: Secondary | ICD-10-CM

## 2014-05-18 LAB — TSH: TSH: 7.36 u[IU]/mL — AB (ref 0.35–4.50)

## 2014-05-18 LAB — T4, FREE: Free T4: 0.99 ng/dL (ref 0.60–1.60)

## 2014-05-19 ENCOUNTER — Encounter: Payer: Self-pay | Admitting: Neurology

## 2014-05-19 ENCOUNTER — Other Ambulatory Visit: Payer: Self-pay | Admitting: Internal Medicine

## 2014-05-19 ENCOUNTER — Ambulatory Visit (INDEPENDENT_AMBULATORY_CARE_PROVIDER_SITE_OTHER): Payer: BC Managed Care – PPO | Admitting: Neurology

## 2014-05-19 VITALS — Ht 62.0 in | Wt 135.0 lb

## 2014-05-19 DIAGNOSIS — R202 Paresthesia of skin: Secondary | ICD-10-CM

## 2014-05-19 DIAGNOSIS — E890 Postprocedural hypothyroidism: Secondary | ICD-10-CM

## 2014-05-19 DIAGNOSIS — R51 Headache: Secondary | ICD-10-CM

## 2014-05-19 DIAGNOSIS — R519 Headache, unspecified: Secondary | ICD-10-CM | POA: Insufficient documentation

## 2014-05-19 LAB — HEPATITIS B DNA, ULTRAQUANTITATIVE, PCR
HEPATITIS B DNA: 565 [IU]/mL — AB (ref <20)
Hepatitis B DNA (Calc): 3288 copies/mL — ABNORMAL HIGH (ref <116)

## 2014-05-19 LAB — THYROGLOBULIN ANTIBODY: Thyroglobulin Ab: <1 IU/mL (ref <2)

## 2014-05-19 LAB — THYROGLOBULIN LEVEL: THYROGLOBULIN: 0.2 ng/mL — AB (ref 2.8–40.9)

## 2014-05-19 MED ORDER — NORTRIPTYLINE HCL 10 MG PO CAPS: ORAL_CAPSULE | ORAL | Status: DC

## 2014-05-19 NOTE — Progress Notes (Signed)
PATIENT: Michelle Hammond DOB: 07-21-1970  HISTORICAL  Michelle Hammond  Is a 43 years old right-handed [place] female, referred by her primary care physician Dr. Marin Comment, accompanied by interpreter at today's clinical visit, She is referred for evaluation of right side pain, numbness  She was diagnosed with thyroid papillary carcinoma, underwent thyroidectomy in February 05 2014, tumor did extend to near the surgical margin, one lymph node was negative for metastatic disease  She was put on no salt, no iodine treatment afterwards,she also suffered depression, anxiety symptoms, eventually received radioactive iodine treatment, since October 2015, her symptoms has much improved,  But few days after surgery, she began to notice tightness, achy pain along the right surgical scar, spreading to right neck, right chest, right shoulder,  Over the past few months, also involving the right arm, right leg, subjective weakness on the right side,  She also complains of worsening right-sided low back pain, radiating pain to her right hip, right leg,  She was evaluated by neurosurgeon Dr. Arnoldo Morale, MRI of cervical spine showed Degenerative spondylosis most pronounced at C4-5 and C5-6. At C5-6, there is mild foraminal narrowing bilaterally, but no definite neural compression. The findings could be associated with neck pain or neural irritation.  She was offered physical therapy, NSAIDs, without significant improvement of her symptoms,  REVIEW OF SYSTEMS: Full 14 system review of systems performed and notable only for weight gain, fatigue, chest pain, swelling in legs, blurred vision, shortness of breath, blood in stool, constipation, easy bruising, feeling cold,Flushing, joint pain, cramps, achy muscles, allergy, skin sensitivity, confusion, headaches, numbness, weakness, dizziness, sleepiness, restless leg, depression, not enough sleep, decreased energy, change in appetite, disinterested in  activities  ALLERGIES: Allergies  Allergen Reactions  . Aspirin Other (See Comments)    Easily bruising    HOME MEDICATIONS: Current Outpatient Prescriptions on File Prior to Visit  Medication Sig Dispense Refill  . calcium carbonate (OS-CAL - DOSED IN MG OF ELEMENTAL CALCIUM) 1250 MG tablet Take 2 tablets (1,000 mg of elemental calcium total) by mouth 2 (two) times daily with a meal. 60 tablet 0  . cetirizine (ZYRTEC) 10 MG tablet Take 10 mg by mouth daily. PT STATES HER BRAND IS ALLER-TEC    . Chlorphen-Pseudoephed-APAP (TYLENOL ALLERGY SINUS PO) Take 1 tablet by mouth daily.    . Cholecalciferol (VITAMIN D-3) 5000 UNITS TABS Take 1 tablet by mouth daily.    Marland Kitchen esomeprazole (NEXIUM) 40 MG capsule Take 1 capsule (40 mg total) by mouth daily. 30 capsule 3  . glucosamine-chondroitin 500-400 MG tablet Take 1 tablet by mouth 2 (two) times daily.    Marland Kitchen HYDROcodone-acetaminophen (NORCO/VICODIN) 5-325 MG per tablet Take 1-2 tablets by mouth every 4 (four) hours as needed for moderate pain. 20 tablet 0  . Multiple Vitamin (MULTIVITAMIN WITH MINERALS) TABS tablet Take 1 tablet by mouth daily.    Marland Kitchen omeprazole (PRILOSEC) 40 MG capsule Take 1 capsule (40 mg total) by mouth daily. 30 capsule 3  . OVER THE COUNTER MEDICATION PREPARATION H SUPPOSITORY  ONCE A DAY AT NIGHT    . potassium chloride SA (K-DUR,KLOR-CON) 20 MEQ tablet Take 1 tablet (20 mEq total) by mouth daily. Recheck in 2 weeks your potasssium level, labs only 14 tablet 0  . SYNTHROID 88 MCG tablet Take 1 tablet (88 mcg total) by mouth daily before breakfast. 30 tablet 3  . hydrochlorothiazide (MICROZIDE) 12.5 MG capsule Take 1 capsule (12.5 mg total) by mouth daily. Need follow-uo in 1  month for repeat labs 30 capsule 5   No current facility-administered medications on file prior to visit.    PAST MEDICAL HISTORY: Past Medical History  Diagnosis Date  . Hepatitis B   . Thyroid disease     SOMETIMES FAST HEART BEAT, MUSCLES TINGLE,  SOMETIMES HARD TO SWALLOW,  AND FEELS LIKE NEEDS TO CLEAR THROAT  . Allergy     SEASONAL   . Cancer     Thyroid cancer   . Numbness     WHOLE RIGHT SIDE OF BODY FEELS NUMB AND PAINFUL,  AND PAIN LEFT SIDE OF BODY PAINFUL - BUT NO NUMBNESS LEFT SIDE - STATES SHE HAS HAD PROBLEM FOR YEARS- DOES NOT KNOW THE CAUSE OF HER NUMBNESS AND PAIN  . Shortness of breath   . Asthma     NO LONGER USING INHALER  . GERD (gastroesophageal reflux disease)     EVERY TIME I EAT I HAVE BURPING, INDIGESTION AND DIARRHEA  . Hemorrhoids     PAINFUL AND SOMETIMES BLEEDING  . Bruises easily     PAST SURGICAL HISTORY: Past Surgical History  Procedure Laterality Date  . No previous surgery    . Thyroidectomy N/A 02/05/2014    Procedure: TOTAL THYROIDECTOMY ;  Surgeon: Earnstine Regal, MD;  Location: [facility];  Service: General;  Laterality: N/A;  . Lymph node dissection N/A 02/05/2014    Procedure: CENTRAL LYMPH NODE DISSECTION;  Surgeon: Earnstine Regal, MD;  Location: [facility];  Service: General;  Laterality: N/A;    FAMILY HISTORY: Family History  Problem Relation Age of Onset  . Hypertension Mother   . Kidney disease Father     SOCIAL HISTORY:  History   Social History  . Marital Status: Divorced    Spouse Name: N/A    Number of Children: 1  . 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 Smoking status: Never Smoker   . Smokeless tobacco: Never Used  . Alcohol Use: No  . Drug Use: No  . Sexual Activity: Not Currently    Birth Control/ Protection: None   Other Topics Concern  . Not on file   Social History Narrative   Patient lives at home alone.    Patient is not working.   Education high school.   Caffeine none    Right handed.     PHYSICAL EXAM   Filed Vitals:   05/19/14 1552  Height: 5\' 2"  (1.575 m)  Weight: 135 lb (61.236 kg)    Not recorded      Body mass index is 24.69 kg/(m^2).   Generalized: In no acute  distress  Neck: Supple, no carotid bruits   Cardiac: Regular rate rhythm  Pulmonary: Clear to auscultation bilaterally  Musculoskeletal: No deformity  Neurological examination  Mentation: Alert oriented to time, place, history taking, and causual conversation, anxious-looking middle-aged female  Cranial nerve II-XII: Pupils were equal round reactive to light. Extraocular movements were full.  Visual field were full on confrontational test. Bilateral fundi were sharp.  Facial sensation and strength were normal. Hearing was intact to finger rubbing bilaterally. Uvula tongue midline.  Head turning and shoulder shrug and were normal and symmetric.Tongue protrusion into cheek strength was normal.  Motor: Normal tone, bulk and strength.  Sensory: Intact to fine touch, pinprick, preserved vibratory sensation, and proprioception at toes.  Coordination: Normal finger to nose, heel-to-shin bilaterally there was no truncal  ataxia  Gait: Rising up from seated position without assistance, normal stance, without trunk ataxia, moderate stride, good arm swing, smooth turning, able to perform tiptoe, and heel walking without difficulty.   Romberg signs: Negative  Deep tendon reflexes: Brachioradialis 2/2, biceps 2/2, triceps 2/2, patellar 2/2, Achilles 2/2, plantar responses were flexor bilaterally.   DIAGNOSTIC DATA (LABS, IMAGING, TESTING) - I reviewed patient records, labs, notes, testing and imaging myself where available.  Lab Results  Component Value Date   WBC 9.0 01/26/2014   HGB 14.3 01/26/2014   HCT 40.3 01/26/2014   MCV 83.3 01/26/2014   PLT 336 01/26/2014      Component Value Date/Time   NA 136 05/11/2014 0942   K 3.7 05/11/2014 0942   CL 103 05/11/2014 0942   CO2 27 05/11/2014 0942   GLUCOSE 92 05/11/2014 0942   BUN 5* 05/11/2014 0942   CREATININE 0.55 05/11/2014 0942   CREATININE 0.47* 02/06/2014 0443   CALCIUM 8.7 05/11/2014 0942   PROT 7.5 05/11/2014 0942   ALBUMIN  4.3 05/11/2014 0942   AST 18 05/11/2014 0942   ALT 9 05/11/2014 0942   ALKPHOS 50 05/11/2014 0942   BILITOT 0.5 05/11/2014 0942   GFRNONAA >89 05/11/2014 0942   GFRNONAA >90 02/06/2014 0443   GFRAA >89 05/11/2014 0942   GFRAA >90 02/06/2014 0443   Lab Results  Component Value Date   CHOL 171 01/26/2014   HDL 57 01/26/2014   LDLCALC 85 01/26/2014   TRIG 146 01/26/2014   CHOLHDL 3.0 01/26/2014   Lab Results  Component Value Date   HGBA1C 5.2 02/16/2014   Lab Results  Component Value Date   VITAMINB12 580 01/26/2014   Lab Results  Component Value Date   TSH 7.36* 05/18/2014      ASSESSMENT AND PLAN  Michelle Hammond is a 43 y.o. female complains of right neck pain, shoulder pain, also involving her right arm, right leg, this is following her depression anxiety, recent thyroid cancer treatment,   1, need to rule out left hemisphere pathology, proceed with MRI of brain 2,,she complains of worsening low back pain, radiating pain to her right hip, right leg, proceed with MRI of lumbar 3. Return to clinic one month   Marcial Pacas, M.D. Ph.D.  [facility] [address] [place], [place] [address] [phone]

## 2014-05-20 ENCOUNTER — Telehealth: Payer: Self-pay | Admitting: Neurology

## 2014-05-20 NOTE — Telephone Encounter (Signed)
Please advise when this patient is to come back for a revisit to see you, I looked in the office notes but this information was missing.

## 2014-05-20 NOTE — Telephone Encounter (Signed)
RTC in 2-3 weeks,

## 2014-05-21 ENCOUNTER — Encounter: Payer: Self-pay | Admitting: Internal Medicine

## 2014-05-21 ENCOUNTER — Ambulatory Visit (INDEPENDENT_AMBULATORY_CARE_PROVIDER_SITE_OTHER): Payer: BC Managed Care – PPO | Admitting: Internal Medicine

## 2014-05-21 VITALS — BP 138/103 | HR 105 | Temp 98.1°F | Resp 12 | Wt 137.0 lb

## 2014-05-21 DIAGNOSIS — C73 Malignant neoplasm of thyroid gland: Secondary | ICD-10-CM

## 2014-05-21 DIAGNOSIS — E890 Postprocedural hypothyroidism: Secondary | ICD-10-CM

## 2014-05-21 NOTE — Patient Instructions (Signed)
Please come back for an appt in January. Please come back for labs in 1 month. Continue 88 mcg levothyroxine for now.

## 2014-05-21 NOTE — Telephone Encounter (Signed)
Called patient and scheduled her for 06/11/14 revisit with Dr. Krista Blue.

## 2014-05-21 NOTE — Progress Notes (Signed)
Patient ID: Michelle Hammond, female   DOB: 03/05/1971, 43 y.o.   MRN: [id number]   HPI  Michelle Hammond is a 43 y.o.-year-old female, returning for f/u for papillary thyroid cancer. She is here with a friend. Last visit 1 mo ago.  Reviewed hx: Pt. has been found to have 2 L thyroid nodules (one of 7 x 10 mm and one of 13 x 14 mm, no calcifications) on thyroid U/S 11/14/2013 in [place] >> had a thyroid nodule FNA on 11/30/2013 (unclear of which of the 2 nodules) >> papillary thyroid carcinoma. She brought records in [place]. The interpreter translated the reports for [place]. The word carcinoma is easily distinguishable on the report.  02/05/2014: total thyroidectomy by Dr Harlow Asa  Diagnosis 1. Thyroid, thyroidectomy - PAPILLARY THYROID CARCINOMA, THREE FOCI, 1.4, 0.3, AND 0.1 CM. - FOCAL VASCULAR INVOLVEMENT BY TUMOR. - FOCAL EXTRA THYROID EXTENSION. - MARGINS NOT INVOLVED. - SEE ONCOLOGY TABLE. 2. Lymph node, biopsy, central compartment - ONE BENIGN LYMPH NODE (0/1). Microscopic Comment 1. THYROID Specimen: Thyroid and central compartment lymph node. Procedure: Thyroidectomy and lymph node biopsy. Specimen Integrity (intact/fragmented): Focally disrupted. Tumor focality: Three foci of tumor, left lobe 1.4 cm, right lobe, 0.3 cm and 0.1 cm..  Dominant tumor: Left superior thyroid. Maximum tumor size (cm): 1.4 cm. Tumor laterality: Left. Histologic type (including subtype and/or unique features as applicable): Papillary carcinoma. Tumor capsule: N/A Extrathyroidal extension: Present. Margins: Free of tumor, invasive tumor focally less than 0.1 cm from inked margin. Lymph - Vascular invasion: Present, focal.  Second and additional tumors: Right lobe Tumor size(s): 0.3 and 0.1 cm Tumor laterality: Right. Histologic type (including subtype and/or unique features as applicable) : Papillary thyroid carcinoma. Tumor capsule: N/A Extrathyroidal extension: No. Margins: Free of tumor. Lymph  - Vascular invasion: No. Lymph nodes: # examined 1; # positive; 0 TNM code: pT3 , pN0 04/13/2014: RAI Tx 103 mCi I-131 04/21/2014: Post tx WBS: focal activity in thyroid bed only. Component     Latest Ref Rng 05/18/2014  Thyroglobulin     2.8 - 40.9 ng/mL 0.2 (L)  Thyroglobulin Ab     <2 IU/mL <1   She was started on Synthroid 88 mcg daily after the surgery - this had to be stopped for ~3 weeks to allow the TSH to increase for the remnant ablation. She felt poorly during the TSH increase, and is slowly improving after restarted the LT4. Still feeling tired, poor memory, has weight gain, taste change. She also described pain in R neck and radiating to R arm. She has cervical spondylosis and having PT. She is seeing neurology.   She takes the LT4: - fasting - w/ water anymore - + PPI and MVI  - not in am  I reviewed pt's thyroid tests: Lab Results  Component Value Date   TSH 7.36* 05/18/2014   TSH 50.82* 04/10/2014   TSH 12.45* 04/02/2014   TSH 4.24 03/20/2014   TSH 0.57 03/02/2014   TSH 0.47 12/16/2013   TSH 0.636 04/08/2010   FREET4 0.99 05/18/2014   FREET4 0.90 03/02/2014   FREET4 0.71 12/16/2013   FREET4 1.04 04/08/2010  2012: TSH 0.628  Pt describes: - + fatigue - + hot flushes - no palpitations - + SOB - no tremors - no weight gain - + constipation - + dry skin - + hair loss - no depression  Pt's thyroidectomy scar still tingling.  I reviewed pt's medications, allergies, PMH, social hx, family hx and no changes  required, except as mentioned above.   ROS: Constitutional: no weight gain, + fatigue, + hot flushes, + poor sleep Eyes: + blurry vision, no xerophthalmia ENT: no sore throat, no nodules palpated in throat, no dysphagia/odynophagia, + hoarseness Cardiovascular: no CP/+ SOB/palpitations/+leg swelling Respiratory: no cough/+ SOB Gastrointestinal: + N/no V/D/+ C, + acid reflux Musculoskeletal: + both muscle/joint aches Skin:+ tingling in thyroid scar  area; + easy bruising, + hair loss Neurological: no tremors/numbness/tingling/dizziness, + HA  PE: BP 138/103 mmHg  Pulse 105  Temp(Src) 98.1 F (36.7 C) (Oral)  Resp 12  Wt 137 lb (62.143 kg)  SpO2 98%  LMP 05/01/2014 Body mass index is 25.05 kg/(m^2).  Wt Readings from Last 3 Encounters:  05/21/14 137 lb (62.143 kg)  05/19/14 135 lb (61.236 kg)  05/11/14 140 lb (63.504 kg)   Constitutional: overweight, in NAD Eyes: PERRLA, EOMI, no exophthalmos ENT: moist mucous membranes, no tyroid masses - thyroidectomy scan healing; no cervical lymphadenopathy Cardiovascular: RRR, No MRG Respiratory: CTA B Gastrointestinal: abdomen soft, NT, ND, BS+ Musculoskeletal: no deformities, strength intact in all 4 Skin: moist, warm, no rashes Neurological: no tremor with outstretched hands, DTR normal in all 4  ASSESSMENT: 1. Papillary thyroid cancer  PLAN:  1. PTC - pt again appears anxious about her dx and her many sxs so I tried my best to reassure her - I discussed with the patient (through the interpreter) about her recent thyroid labs: TSH was still high, at 7, but it has decreased from 50 in 1 month, so I suggested to recheck her TFTs in 1 month and, if not at or lower than the LLN at that time, will need to increase her LT4 dose. - we also reviewed her recent Tg, which was undetectable, which is great, and I explained that this is a "cancer marker"; if stays low, this is very reassuring - She tolerated well her recent total thyroidectomy but still has tingling at the surgical site and now has pain in R side of neck and and R arm. She has numbness in R side of body. She is seen by neurology and had a neck MRI showing spondylosis. We discussed that these are not related to her thyroid cancer or surgery, only possibly related to the position during the surgery - will check thyroid tests in 4 weeks: TSH, free T4 - until then, continue LT4 88 mcg daily - again advised to take the thyroid hormone  every day, with water, >30 minutes before breakfast, separated by >4 hours from acid reflux medications, calcium, iron, multivitamins. - I plan to repeat an U/S and getting a thyrogen-stim WBS in 1 year - Return in about 2 months (around 07/21/2014).   - time spent with the patient: 25 min, of which >50% was spent in obtaining information about her symptoms, reviewing her previous labs, evaluations, and treatments, counseling her about her condition (please see the discussed topics above), and developing a plan to further investigate it. she had a number of questions which I addressed.

## 2014-05-25 NOTE — Progress Notes (Signed)
I agree above plan. 

## 2014-05-27 ENCOUNTER — Ambulatory Visit (HOSPITAL_COMMUNITY)
Admission: RE | Admit: 2014-05-27 | Discharge: 2014-05-27 | Disposition: A | Discharge status: Home / Self Care | Payer: BC Managed Care – PPO | Source: Ambulatory Visit | Attending: Obstetrics and Gynecology | Admitting: Obstetrics and Gynecology

## 2014-05-27 DIAGNOSIS — Z1231 Encounter for screening mammogram for malignant neoplasm of breast: Secondary | ICD-10-CM | POA: Insufficient documentation

## 2014-05-28 ENCOUNTER — Ambulatory Visit (INDEPENDENT_AMBULATORY_CARE_PROVIDER_SITE_OTHER): Payer: BC Managed Care – PPO

## 2014-05-28 DIAGNOSIS — R519 Headache, unspecified: Secondary | ICD-10-CM

## 2014-05-28 DIAGNOSIS — R51 Headache: Secondary | ICD-10-CM

## 2014-05-28 DIAGNOSIS — E890 Postprocedural hypothyroidism: Secondary | ICD-10-CM

## 2014-05-28 DIAGNOSIS — R202 Paresthesia of skin: Secondary | ICD-10-CM

## 2014-06-01 ENCOUNTER — Ambulatory Visit: Payer: BC Managed Care – PPO | Admitting: Internal Medicine

## 2014-06-02 ENCOUNTER — Telehealth: Payer: Self-pay | Admitting: Neurology

## 2014-06-02 NOTE — Telephone Encounter (Signed)
I have left message for her for MRI result is released to my chart.  MRI lumbar spine (without) demonstrating: 1. At L4-5, L5-S1: disc bulging and facet hypertrophy with moderate biforaminal stenosis  2. At L3-4: disc bulging and facet hypertrophy with mild biforaminal stenosis

## 2014-06-03 ENCOUNTER — Ambulatory Visit (INDEPENDENT_AMBULATORY_CARE_PROVIDER_SITE_OTHER): Payer: BC Managed Care – PPO

## 2014-06-03 DIAGNOSIS — E890 Postprocedural hypothyroidism: Secondary | ICD-10-CM

## 2014-06-03 DIAGNOSIS — R202 Paresthesia of skin: Secondary | ICD-10-CM

## 2014-06-03 DIAGNOSIS — R51 Headache: Secondary | ICD-10-CM

## 2014-06-08 ENCOUNTER — Other Ambulatory Visit: Payer: Self-pay | Admitting: Obstetrics and Gynecology

## 2014-06-09 NOTE — Progress Notes (Signed)
Quick Note:  Normal MRI brain, released to my chart ______

## 2014-06-11 ENCOUNTER — Encounter: Payer: Self-pay | Admitting: Neurology

## 2014-06-11 ENCOUNTER — Ambulatory Visit (INDEPENDENT_AMBULATORY_CARE_PROVIDER_SITE_OTHER): Payer: BC Managed Care – PPO | Admitting: Neurology

## 2014-06-11 VITALS — BP 152/101 | HR 97 | Ht 62.0 in | Wt 138.0 lb

## 2014-06-11 DIAGNOSIS — R202 Paresthesia of skin: Secondary | ICD-10-CM

## 2014-06-11 MED ORDER — DICLOFENAC SODIUM 3 % TD GEL: TRANSDERMAL | Status: DC

## 2014-06-11 MED ORDER — DULOXETINE HCL 30 MG PO CPEP: ORAL_CAPSULE | ORAL | Status: DC

## 2014-06-11 NOTE — Progress Notes (Signed)
PATIENT: Michelle Hammond DOB: 12-05-1970  HISTORICAL  Clarene T Qin  Is a 43 years old right-handed [place] female, referred by her primary care physician Dr. Marin Comment, accompanied by interpreter at today's clinical visit, She is referred for evaluation of right side pain, numbness  She was diagnosed with thyroid papillary carcinoma, underwent thyroidectomy in February 05 2014, tumor did extend to near the surgical margin, one lymph node was negative for metastatic disease  She was put on no salt, no iodine treatment afterwards,she also suffered depression, anxiety symptoms, eventually received radioactive iodine treatment, since October 2015, her symptoms has much improved,  But few days after surgery, she began to notice tightness, achy pain along the right surgical scar, spreading to right neck, right chest, right shoulder,  Over the past few months, also involving the right arm, right leg, subjective weakness on the right side,  She also complains of worsening right-sided low back pain, radiating pain to her right hip, right leg,  She was evaluated by neurosurgeon Dr. Arnoldo Morale, MRI of cervical spine showed Degenerative spondylosis most pronounced at C4-5 and C5-6. At C5-6, there is mild foraminal narrowing bilaterally, but no definite neural compression. The findings could be associated with neck pain or neural irritation.  She was offered physical therapy, NSAIDs, without significant improvement of her symptoms,  UPDATE Dec 3rd 2015: She continue to complains of right shoulder, arm deep achy pain, tenderness upon deep palpation,  We have reviewed MRI of the brain that was normal, have our of lumbar showed mild degenerative disc disease, but there was no significant foraminal or canal stenosis  REVIEW OF SYSTEMS: Full 14 system review of systems performed and notable only for fatigue, eye redness, blurry vision, shortness of breath, chest tightness, abdominal pain, constipation, weakness,  depression   ALLERGIES: Allergies  Allergen Reactions  . Aspirin Other (See Comments)    Easily bruising    HOME MEDICATIONS: Current Outpatient Prescriptions on File Prior to Visit  Medication Sig Dispense Refill  . calcium carbonate (OS-CAL - DOSED IN MG OF ELEMENTAL CALCIUM) 1250 MG tablet Take 2 tablets (1,000 mg of elemental calcium total) by mouth 2 (two) times daily with a meal. 60 tablet 0  . cetirizine (ZYRTEC) 10 MG tablet Take 10 mg by mouth daily. PT STATES HER BRAND IS ALLER-TEC    . Chlorphen-Pseudoephed-APAP (TYLENOL ALLERGY SINUS PO) Take 1 tablet by mouth daily.    . Cholecalciferol (VITAMIN D-3) 5000 UNITS TABS Take 1 tablet by mouth daily.    Marland Kitchen esomeprazole (NEXIUM) 40 MG capsule Take 1 capsule (40 mg total) by mouth daily. 30 capsule 3  . etodolac (LODINE) 400 MG tablet Take 400 mg by mouth daily.  0  . glucosamine-chondroitin 500-400 MG tablet Take 1 tablet by mouth 2 (two) times daily.    . hydrochlorothiazide (MICROZIDE) 12.5 MG capsule Take 1 capsule (12.5 mg total) by mouth daily. Need follow-uo in 1 month for repeat labs 30 capsule 5  . HYDROcodone-acetaminophen (NORCO/VICODIN) 5-325 MG per tablet Take 1-2 tablets by mouth every 4 (four) hours as needed for moderate pain. 20 tablet 0  . ibuprofen (ADVIL,MOTRIN) 600 MG tablet   0  . Multiple Vitamin (MULTIVITAMIN WITH MINERALS) TABS tablet Take 1 tablet by mouth daily.    . nortriptyline (PAMELOR) 10 MG capsule One po qhs xone week, then 2 tabs po qhs 60 capsule 11  . OVER THE COUNTER MEDICATION PREPARATION H SUPPOSITORY  ONCE A DAY AT NIGHT    .  SYNTHROID 88 MCG tablet Take 1 tablet (88 mcg total) by mouth daily before breakfast. 30 tablet 3   No current facility-administered medications on file prior to visit.    PAST MEDICAL HISTORY: Past Medical History  Diagnosis Date  . Hepatitis B   . Thyroid disease     SOMETIMES FAST HEART BEAT, MUSCLES TINGLE, SOMETIMES HARD TO SWALLOW,  AND FEELS LIKE NEEDS TO  CLEAR THROAT  . Allergy     SEASONAL   . Cancer     Thyroid cancer   . Numbness     WHOLE RIGHT SIDE OF BODY FEELS NUMB AND PAINFUL,  AND PAIN LEFT SIDE OF BODY PAINFUL - BUT NO NUMBNESS LEFT SIDE - STATES SHE HAS HAD PROBLEM FOR YEARS- DOES NOT KNOW THE CAUSE OF HER NUMBNESS AND PAIN  . Shortness of breath   . Asthma     NO LONGER USING INHALER  . GERD (gastroesophageal reflux disease)     EVERY TIME I EAT I HAVE BURPING, INDIGESTION AND DIARRHEA  . Hemorrhoids     PAINFUL AND SOMETIMES BLEEDING  . Bruises easily     PAST SURGICAL HISTORY: Past Surgical History  Procedure Laterality Date  . No previous surgery    . Thyroidectomy N/A 02/05/2014    Procedure: TOTAL THYROIDECTOMY ;  Surgeon: Earnstine Regal, MD;  Location: [facility];  Service: General;  Laterality: N/A;  . Lymph node dissection N/A 02/05/2014    Procedure: CENTRAL LYMPH NODE DISSECTION;  Surgeon: Earnstine Regal, MD;  Location: [facility];  Service: General;  Laterality: N/A;    FAMILY HISTORY: Family History  Problem Relation Age of Onset  . Hypertension Mother   . Kidney disease Father     SOCIAL HISTORY:  History   Social History  . Marital Status: Divorced    Spouse Name: N/A    Number of Children: 1  . 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 Smoking status: Never Smoker   . Smokeless tobacco: Never Used  . Alcohol Use: No  . Drug Use: No  . Sexual Activity: Not Currently    Birth Control/ Protection: None   Other Topics Concern  . Not on file   Social History Narrative   Patient lives at home alone.    Patient is not working.   Education high school.   Caffeine none    Right handed.     PHYSICAL EXAM   Filed Vitals:   06/11/14 1415  BP: 152/101  Pulse: 97  Height: 5\' 2"  (1.575 m)  Weight: 138 lb (62.596 kg)    Not recorded      Body mass index is 25.23 kg/(m^2).   Generalized: In no acute distress  Neck: Supple,  no carotid bruits   Cardiac: Regular rate rhythm  Pulmonary: Clear to auscultation bilaterally  Musculoskeletal: Right shoulder tenderness upon deep palpitation  Neurological examination  Mentation: Alert oriented to time, place, history taking, and causual conversation, anxious-looking middle-aged female  Cranial nerve II-XII: Pupils were equal round reactive to light. Extraocular movements were full.  Visual field were full on confrontational test. Bilateral fundi were sharp.  Facial sensation and strength were normal. Hearing was intact to finger rubbing bilaterally. Uvula tongue midline.  Head turning and shoulder shrug and were normal and symmetric.Tongue protrusion into cheek strength was normal.  Motor: Normal tone, bulk and strength.  Sensory: Intact to fine  touch, pinprick, preserved vibratory sensation, and proprioception at toes.  Coordination: Normal finger to nose, heel-to-shin bilaterally there was no truncal ataxia  Gait: Rising up from seated position without assistance, normal stance, without trunk ataxia, moderate stride, good arm swing, smooth turning, able to perform tiptoe, and heel walking without difficulty.   Romberg signs: Negative  Deep tendon reflexes: Brachioradialis 2/2, biceps 2/2, triceps 2/2, patellar 2/2, Achilles 2/2, plantar responses were flexor bilaterally.   DIAGNOSTIC DATA (LABS, IMAGING, TESTING) - I reviewed patient records, labs, notes, testing and imaging myself where available.  Lab Results  Component Value Date   WBC 9.0 01/26/2014   HGB 14.3 01/26/2014   HCT 40.3 01/26/2014   MCV 83.3 01/26/2014   PLT 336 01/26/2014      Component Value Date/Time   NA 136 05/11/2014 0942   K 3.7 05/11/2014 0942   CL 103 05/11/2014 0942   CO2 27 05/11/2014 0942   GLUCOSE 92 05/11/2014 0942   BUN 5* 05/11/2014 0942   CREATININE 0.55 05/11/2014 0942   CREATININE 0.47* 02/06/2014 0443   CALCIUM 8.7 05/11/2014 0942   PROT 7.5 05/11/2014 0942    ALBUMIN 4.3 05/11/2014 0942   AST 18 05/11/2014 0942   ALT 9 05/11/2014 0942   ALKPHOS 50 05/11/2014 0942   BILITOT 0.5 05/11/2014 0942   GFRNONAA >89 05/11/2014 0942   GFRNONAA >90 02/06/2014 0443   GFRAA >89 05/11/2014 0942   GFRAA >90 02/06/2014 0443   Lab Results  Component Value Date   CHOL 171 01/26/2014   HDL 57 01/26/2014   LDLCALC 85 01/26/2014   TRIG 146 01/26/2014   CHOLHDL 3.0 01/26/2014   Lab Results  Component Value Date   HGBA1C 5.2 02/16/2014   Lab Results  Component Value Date   VITAMINB12 580 01/26/2014   Lab Results  Component Value Date   TSH 7.36* 05/18/2014      ASSESSMENT AND PLAN  Delphine T Sica is a 43 y.o. female complains of right neck pain, shoulder pain, also involving her right arm, right leg, this is following her depression anxiety, recent thyroid cancer treatment,   1, most consistent with musculoskeletal etiology, 2. Continue when necessary NSAIDs, I also started Cymbalta, 30 mg, titrating to 60 mg every day 3, continue follow-up with his primary care physician   Marcial Pacas, M.D. Ph.D.  [facility] [address] [place], [place] [address] [phone]

## 2014-06-18 ENCOUNTER — Other Ambulatory Visit: Payer: BC Managed Care – PPO

## 2014-06-18 ENCOUNTER — Other Ambulatory Visit (INDEPENDENT_AMBULATORY_CARE_PROVIDER_SITE_OTHER): Payer: BC Managed Care – PPO

## 2014-06-18 DIAGNOSIS — E876 Hypokalemia: Secondary | ICD-10-CM

## 2014-06-18 DIAGNOSIS — E890 Postprocedural hypothyroidism: Secondary | ICD-10-CM

## 2014-06-18 DIAGNOSIS — E2839 Other primary ovarian failure: Secondary | ICD-10-CM

## 2014-06-18 LAB — BASIC METABOLIC PANEL
BUN: 9 mg/dL (ref 6–23)
CO2: 24 meq/L (ref 19–32)
Calcium: 8.8 mg/dL (ref 8.4–10.5)
Chloride: 100 mEq/L (ref 96–112)
Creatinine, Ser: 0.5 mg/dL (ref 0.4–1.2)
GFR: 135.04 mL/min (ref >60.00)
GLUCOSE: 124 mg/dL — AB (ref 70–99)
Potassium: 3.6 mEq/L (ref 3.5–5.1)
SODIUM: 133 meq/L — AB (ref 135–145)

## 2014-06-18 LAB — TSH: TSH: 3.3 u[IU]/mL (ref 0.35–4.50)

## 2014-06-18 LAB — T4, FREE: Free T4: 0.96 ng/dL (ref 0.60–1.60)

## 2014-06-19 ENCOUNTER — Other Ambulatory Visit: Payer: Self-pay | Admitting: *Deleted

## 2014-06-19 MED ORDER — LEVOTHYROXINE SODIUM 100 MCG PO TABS: 100.0000 ug | ORAL_TABLET | Freq: Every day | ORAL | Status: DC

## 2014-07-13 ENCOUNTER — Telehealth: Payer: Self-pay | Admitting: Internal Medicine

## 2014-07-13 NOTE — Telephone Encounter (Signed)
Pt has missed her cycle for December since the increase in the synthyroid. She has also had red spots on her face that feel hot/burning.

## 2014-07-13 NOTE — Telephone Encounter (Signed)
Please read note below and advise.  

## 2014-07-13 NOTE — Telephone Encounter (Signed)
Thyroid tests were not abnormal on 06/18/2014. She may have missed her cycle b/c perimenopause, but she can come back for another TSH, fT4.

## 2014-07-13 NOTE — Telephone Encounter (Signed)
Called pt and advised her per Dr Gherghe's note. Pt understood.  

## 2014-07-21 ENCOUNTER — Encounter: Payer: Self-pay | Admitting: Internal Medicine

## 2014-07-21 ENCOUNTER — Ambulatory Visit (INDEPENDENT_AMBULATORY_CARE_PROVIDER_SITE_OTHER): Payer: BLUE CROSS/BLUE SHIELD | Admitting: Internal Medicine

## 2014-07-21 VITALS — BP 112/66 | HR 97 | Temp 98.3°F | Resp 12 | Wt 138.0 lb

## 2014-07-21 DIAGNOSIS — C73 Malignant neoplasm of thyroid gland: Secondary | ICD-10-CM

## 2014-07-21 DIAGNOSIS — E890 Postprocedural hypothyroidism: Secondary | ICD-10-CM

## 2014-07-21 MED ORDER — LEVOTHYROXINE SODIUM 88 MCG PO TABS: 88.0000 ug | ORAL_TABLET | Freq: Every day | ORAL | Status: DC

## 2014-07-21 NOTE — Patient Instructions (Signed)
Please return in 12/2014. Please come back for labs in 5 weeks.

## 2014-07-21 NOTE — Progress Notes (Signed)
Patient ID: Michelle Hammond, female   DOB: 10-27-1970, 44 y.o.   MRN: [id number]   HPI  Michelle Hammond is a 44 y.o.-year-old female, returning for f/u for papillary thyroid cancer. Last visit 2 mo ago.  Reviewed hx: Pt. has been found to have 2 L thyroid nodules (one of 7 x 10 mm and one of 13 x 14 mm, no calcifications) on thyroid U/S 11/14/2013 in [place] >> had a thyroid nodule FNA on 11/30/2013 (unclear of which of the 2 nodules) >> papillary thyroid carcinoma. She brought records in [place]. The interpreter translated the reports for [place]. The word carcinoma was easily distinguishable on the report.  02/05/2014: total thyroidectomy by Dr Harlow Asa  Diagnosis 1. Thyroid, thyroidectomy - PAPILLARY THYROID CARCINOMA, THREE FOCI, 1.4, 0.3, AND 0.1 CM. - FOCAL VASCULAR INVOLVEMENT BY TUMOR. - FOCAL EXTRA THYROID EXTENSION. - MARGINS NOT INVOLVED. - SEE ONCOLOGY TABLE. 2. Lymph node, biopsy, central compartment - ONE BENIGN LYMPH NODE (0/1). Microscopic Comment 1. THYROID Specimen: Thyroid and central compartment lymph node. Procedure: Thyroidectomy and lymph node biopsy. Specimen Integrity (intact/fragmented): Focally disrupted. Tumor focality: Three foci of tumor, left lobe 1.4 cm, right lobe, 0.3 cm and 0.1 cm..  Dominant tumor: Left superior thyroid. Maximum tumor size (cm): 1.4 cm. Tumor laterality: Left. Histologic type (including subtype and/or unique features as applicable): Papillary carcinoma. Tumor capsule: N/A Extrathyroidal extension: Present. Margins: Free of tumor, invasive tumor focally less than 0.1 cm from inked margin. Lymph - Vascular invasion: Present, focal.  Second and additional tumors: Right lobe Tumor size(s): 0.3 and 0.1 cm Tumor laterality: Right. Histologic type (including subtype and/or unique features as applicable) : Papillary thyroid carcinoma. Tumor capsule: N/A Extrathyroidal extension: No. Margins: Free of tumor. Lymph - Vascular invasion:  No. Lymph nodes: # examined 1; # positive; 0 TNM code: pT3 , pN0 04/13/2014: RAI Tx 103 mCi I-131 04/21/2014: Post tx WBS: focal activity in thyroid bed only. Component     Latest Ref Rng 05/18/2014  Thyroglobulin     2.8 - 40.9 ng/mL 0.2 (L)  Thyroglobulin Ab     <2 IU/mL <1   She was started on Synthroid 88 mcg daily after the surgery - this had to be stopped for ~3 weeks to allow the TSH to increase for the remnant ablation. She felt poorly during the TSH increase, and is slowly improving after restarted the LT4.   Her last TSH was 3.3 >> we increased the dose to to 100 mcg >> felt SOB, CP, increased appetite, hair loss. She switched to 88 mcg after 3 weeks >> feeling better.   She takes the LT4 88 mcg: - fasting - w/ water  - b'fast 1-2 h later - + occasional PPI and MVI - late in the day - + Ca at night  I reviewed pt's thyroid tests: Lab Results  Component Value Date   TSH 3.30 06/18/2014   TSH 7.36* 05/18/2014   TSH 50.82* 04/10/2014   TSH 12.45* 04/02/2014   TSH 4.24 03/20/2014   TSH 0.57 03/02/2014   TSH 0.47 12/16/2013   TSH 0.636 04/08/2010   FREET4 0.96 06/18/2014   FREET4 0.99 05/18/2014   FREET4 0.90 03/02/2014   FREET4 0.71 12/16/2013   FREET4 1.04 04/08/2010  2012: TSH 0.628  Pt still describes: - + fatigue - + hot flushes - no palpitations - + SOB - no tremors - no weight gain - no constipation - + hair loss - no depression  I reviewed pt's  medications, allergies, PMH, social hx, family hx, and changes were documented in the history of present illness. Otherwise, unchanged from my initial visit note.  ROS: Constitutional:+ weight gain, + fatigue, + hot flushes, + poor sleep, nocturia+  Eyes: + blurry vision, no xerophthalmia ENT: + sore throat, no nodules palpated in throat, no dysphagia/odynophagia, + hoarseness Cardiovascular: + CP/+ SOB/no palpitations/ no leg swelling Respiratory: = cough/+ SOB Gastrointestinal: no N/no V/D/+ C, + acid  reflux Musculoskeletal: + both muscle/joint aches Skin:+ tingling in thyroid scar area; + easy bruising, + hair loss Neurological: no tremors/numbness/tingling/dizziness, + HA  PE: BP 112/66 mmHg  Pulse 97  Temp(Src) 98.3 F (36.8 C) (Oral)  Resp 12  Wt 138 lb (62.596 kg)  SpO2 97%  LMP  Body mass index is 25.23 kg/(m^2).  Wt Readings from Last 3 Encounters:  07/21/14 138 lb (62.596 kg)  06/11/14 138 lb (62.596 kg)  05/21/14 137 lb (62.143 kg)   Constitutional: normalweight, in NAD Eyes: PERRLA, EOMI, no exophthalmos ENT: moist mucous membranes, no tyroid masses - thyroidectomy scan healing; no cervical lymphadenopathy Cardiovascular: RRR, No MRG Respiratory: CTA B Gastrointestinal: abdomen soft, NT, ND, BS+ Musculoskeletal: no deformities, strength intact in all 4 Skin: moist, warm, no rashes Neurological: no tremor with outstretched hands, DTR normal in all 4  ASSESSMENT: 1. Papillary thyroid cancer  PLAN:  1. PTC - I discussed with the patient about her recent thyroid labs: TSH was still higher than target, at 3.3, so  I suggested to increase the dose to 100 g and recheck her TFTs in 1 month. She could not tolerate the 100 g so she switched back to 88 g about 2 weeks ago. We discussed about repeating her thyroid tests in another month and, if we need to increase the levothyroxine dose, we can alternate 88 with 100 g to make it better tolerable for her. - we also reviewed her recent Tg, which was undetectable, which is great, and I explained that this is a "cancer marker"; if stays low, this is very reassuring - will check thyroid tests in 4 weeks: TSH, free T4 - again advised to take the thyroid hormone every day, with water, >30 minutes before breakfast, separated by >4 hours from acid reflux medications, calcium, iron, multivitamins. She is taking it correctly - I plan to repeat an U/S and getting a thyrogen-stim WBS in 1 year after her radioactive iodine treatment. -  Return in about 6 months (around 01/19/2015).

## 2014-08-24 ENCOUNTER — Ambulatory Visit (INDEPENDENT_AMBULATORY_CARE_PROVIDER_SITE_OTHER): Payer: BLUE CROSS/BLUE SHIELD | Admitting: Emergency Medicine

## 2014-08-24 ENCOUNTER — Other Ambulatory Visit: Payer: BC Managed Care – PPO

## 2014-08-24 ENCOUNTER — Ambulatory Visit (INDEPENDENT_AMBULATORY_CARE_PROVIDER_SITE_OTHER): Payer: BLUE CROSS/BLUE SHIELD

## 2014-08-24 VITALS — BP 124/94 | HR 80 | Temp 97.8°F | Resp 16 | Ht 61.25 in | Wt 139.6 lb

## 2014-08-24 DIAGNOSIS — R059 Cough, unspecified: Secondary | ICD-10-CM

## 2014-08-24 DIAGNOSIS — R05 Cough: Secondary | ICD-10-CM

## 2014-08-24 DIAGNOSIS — R0602 Shortness of breath: Secondary | ICD-10-CM

## 2014-08-24 DIAGNOSIS — Z3200 Encounter for pregnancy test, result unknown: Secondary | ICD-10-CM

## 2014-08-24 LAB — POCT CBC
Granulocyte percent: 60.4 %G (ref 37–80)
HCT, POC: 40 % (ref 37.7–47.9)
HEMOGLOBIN: 13.1 g/dL (ref 12.2–16.2)
Lymph, poc: 2 (ref 0.6–3.4)
MCH, POC: 29.1 pg (ref 27–31.2)
MCHC: 32.7 g/dL (ref 31.8–35.4)
MCV: 88.9 fL (ref 80–97)
MID (cbc): 0.4 (ref 0–0.9)
MPV: 6.3 fL (ref 0–99.8)
POC Granulocyte: 3.6 (ref 2–6.9)
POC LYMPH PERCENT: 33.1 %L (ref 10–50)
POC MID %: 6.5 %M (ref 0–12)
Platelet Count, POC: 272 10*3/uL (ref 142–424)
RBC: 4.5 M/uL (ref 4.04–5.48)
RDW, POC: 12.4 %
WBC: 6 10*3/uL (ref 4.6–10.2)

## 2014-08-24 LAB — POCT URINE PREGNANCY: Preg Test, Ur: NEGATIVE

## 2014-08-24 MED ORDER — METOPROLOL SUCCINATE ER 25 MG PO TB24: 25.0000 mg | ORAL_TABLET | Freq: Every day | ORAL | Status: DC

## 2014-08-24 NOTE — Patient Instructions (Signed)

## 2014-08-24 NOTE — Progress Notes (Addendum)
Subjective:  This chart was scribed for Nena Jordan MD, by Tamsen Roers, at [facility].  This patient was seen in room 3 and the patient's care was started at 12:40 PM.    Patient ID: Michelle Hammond, female    DOB: 09-05-1970, 44 y.o.   MRN: [id number]  HPI  HPI Comments: Michelle Hammond is a 44 y.o. female who presents to [facility] for intermittent headaches onset last week as well as chest pain when she takes a deep breath.  She has associated symptoms of light headedness, body aches/ slight numbness.  She also would like to get her blood pressure checked today (states it is always 90 after her surgery.) She went to [place] and they found a nodule in her thyroid and had surgical resection of her cancer done here in [place] (last year). Patient will be seen by her thyroid doctor next week ( last saw 1 month ago) Patients PCP is  Dr. Doreene Burke.  Patient had her last menstrual period on the 3rd of February.  Patient takes calcium and vitamin D. Patient does not take birth control.  Patient just had a physical exam in July with Dr. Brigitte Pulse.  She has hepatitis B and had a chest x ray done July 2015. Patient is not pregnant.   Bp left arm: 140/90   Patient Active Problem List   Diagnosis Date Noted  . 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 Paresthesia of right leg 05/19/2014  . Headache 05/19/2014  . 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 Papillary thyroid carcinoma 02/05/2014  . Bronchial asthma 04/29/2013  . Seasonal allergies 04/29/2013  . PCB (post coital bleeding) 10/18/2012   Past Medical History  Diagnosis Date  . Hepatitis B   . Thyroid disease     SOMETIMES FAST HEART BEAT, MUSCLES TINGLE, SOMETIMES HARD TO SWALLOW,  AND FEELS LIKE NEEDS TO CLEAR THROAT  . Allergy     SEASONAL   . Cancer     Thyroid cancer   . Numbness     WHOLE RIGHT SIDE OF BODY FEELS NUMB AND PAINFUL,  AND PAIN LEFT SIDE OF BODY PAINFUL - BUT NO NUMBNESS LEFT  SIDE - STATES SHE HAS HAD PROBLEM FOR YEARS- DOES NOT KNOW THE CAUSE OF HER NUMBNESS AND PAIN  . Shortness of breath   . Asthma     NO LONGER USING INHALER  . GERD (gastroesophageal reflux disease)     EVERY TIME I EAT I HAVE BURPING, INDIGESTION AND DIARRHEA  . Hemorrhoids     PAINFUL AND SOMETIMES BLEEDING  . Bruises easily    Past Surgical History  Procedure Laterality Date  . No previous surgery    . Thyroidectomy N/A 02/05/2014    Procedure: TOTAL THYROIDECTOMY ;  Surgeon: Earnstine Regal, MD;  Location: [facility];  Service: General;  Laterality: N/A;  . Lymph node dissection N/A 02/05/2014    Procedure: CENTRAL LYMPH NODE DISSECTION;  Surgeon: Earnstine Regal, MD;  Location: [facility];  Service: General;  Laterality: N/A;   Allergies  Allergen Reactions  . Aspirin Other (See Comments)    Easily bruising   Prior to Admission medications   Medication Sig Start Date End Date Taking? Authorizing Provider  calcium carbonate (OS-CAL - DOSED IN MG OF ELEMENTAL CALCIUM) 1250 MG tablet Take 2 tablets (1,000 mg of elemental calcium total) by mouth 2 (two) times daily with a meal. 02/06/14  Yes Armandina Gemma, MD  cetirizine (ZYRTEC)  10 MG tablet Take 10 mg by mouth daily. PT STATES HER BRAND IS ALLER-TEC   Yes Historical Provider, MD  Cholecalciferol (VITAMIN D-3) 5000 UNITS TABS Take 1 tablet by mouth daily.   Yes Historical Provider, MD  Diclofenac Sodium 3 % GEL Apply to right arm, shoulder as needed. 06/11/14  Yes Marcial Pacas, MD  glucosamine-chondroitin 500-400 MG tablet Take 1 tablet by mouth 2 (two) times daily.   Yes Historical Provider, MD  hydrochlorothiazide (MICROZIDE) 12.5 MG capsule Take 1 capsule (12.5 mg total) by mouth daily. Need follow-uo in 1 month for repeat labs 04/20/14  Yes Thao P Le, DO  ibuprofen (ADVIL,MOTRIN) 600 MG tablet  05/15/14  Yes Historical Provider, MD  levothyroxine (SYNTHROID, LEVOTHROID) 88 MCG tablet Take 1 tablet (88 mcg total) by mouth daily. 07/21/14  Yes Philemon Kingdom, MD  OVER THE COUNTER MEDICATION PREPARATION H SUPPOSITORY  ONCE A DAY AT NIGHT   Yes Historical Provider, MD  Chlorphen-Pseudoephed-APAP (TYLENOL ALLERGY SINUS PO) Take 1 tablet by mouth daily.    Historical Provider, MD  DULoxetine (CYMBALTA) 30 MG capsule One po q day xone week, then 2 tabs po qday Patient not taking: Reported on 07/21/2014 06/11/14   Marcial Pacas, MD  esomeprazole (NEXIUM) 40 MG capsule Take 1 capsule (40 mg total) by mouth daily. Patient not taking: Reported on 07/21/2014 04/28/14   Thao P Le, DO  etodolac (LODINE) 400 MG tablet Take 400 mg by mouth daily. 04/28/14   Historical Provider, MD  HYDROcodone-acetaminophen (NORCO/VICODIN) 5-325 MG per tablet Take 1-2 tablets by mouth every 4 (four) hours as needed for moderate pain. Patient not taking: Reported on 07/21/2014 02/06/14   Armandina Gemma, MD  levothyroxine (SYNTHROID, LEVOTHROID) 100 MCG tablet Take 1 tablet (100 mcg total) by mouth daily. Patient not taking: Reported on 07/21/2014 06/19/14   Philemon Kingdom, MD  Multiple Vitamin (MULTIVITAMIN WITH MINERALS) TABS tablet Take 1 tablet by mouth daily.    Historical Provider, MD   History   Social History  . Marital Status: Divorced    Spouse Name: N/A  . Number of Children: 1  . Years of Education: 12   Occupational History  . Not on file.   Social History Main Topics  . Smoking status: Never Smoker   . Smokeless tobacco: Never Used  . Alcohol Use: No  . Drug Use: No  . Sexual Activity: Not Currently    Birth Control/ Protection: None   Other Topics Concern  . Not on file   Social History Narrative   Patient lives at home alone. Single.   Patient is not working.   Education high school.   Caffeine none    Right handed.     Review of Systems  Constitutional: Positive for fatigue. Negative for fever and chills.  HENT: Negative for drooling and nosebleeds.   Respiratory: Positive for shortness of breath. Negative for cough.   Cardiovascular:  Positive for chest pain.  Musculoskeletal: Positive for myalgias.  Neurological: Positive for numbness and headaches.       Objective:   Physical Exam CONSTITUTIONAL: Well developed/well nourished HEAD: Normocephalic/atraumatic EYES: EOMI/PERRL ENMT: Mucous membranes moist NECK: supple no meningeal signs SPINE/BACK:entire spine nontender CV: S1/S2 noted, no murmurs/rubs/gallops noted LUNGS: Dry rales right posterior chest.  ABDOMEN: soft, nontender, no rebound or guarding, bowel sounds noted throughout abdomen GU:no cva tenderness NEURO: Pt is awake/alert/appropriate, moves all extremitiesx4.  No facial droop.   EXTREMITIES: pulses normal/equal, full ROM SKIN: warm, color normal PSYCH:  no abnormalities of mood noted, alert and oriented to situation  Filed Vitals:   08/24/14 1147  BP: 124/94  Pulse: 80  Temp: 97.8 F (36.6 C)  TempSrc: Oral  Resp: 16  Height: 5' 1.25" (1.556 m)  Weight: 139 lb 9.6 oz (63.322 kg)  SpO2: 100%   Results for orders placed or performed in visit on 08/24/14  POCT urine pregnancy  Result Value Ref Range   Preg Test, Ur Negative   POCT CBC  Result Value Ref Range   WBC 6.0 4.6 - 10.2 K/uL   Lymph, poc 2.0 0.6 - 3.4   POC LYMPH PERCENT 33.1 10 - 50 %L   MID (cbc) 0.4 0 - 0.9   POC MID % 6.5 0 - 12 %M   POC Granulocyte 3.6 2 - 6.9   Granulocyte percent 60.4 37 - 80 %G   RBC 4.50 4.04 - 5.48 M/uL   Hemoglobin 13.1 12.2 - 16.2 g/dL   HCT, POC 40.0 37.7 - 47.9 %   MCV 88.9 80 - 97 fL   MCH, POC 29.1 27 - 31.2 pg   MCHC 32.7 31.8 - 35.4 g/dL   RDW, POC 12.4 %   Platelet Count, POC 272 142 - 424 K/uL   MPV 6.3 0 - 99.8 fL  [facility] reading (PRIMARY) by  Dr. Everlene Farrier no acute disease     Assessment & Plan:  Patient is currently on HCTZ 12.5 daily. We'll change to Toprol-XL 25 one a day. Recheck blood pressure in 3 weeks. Routine labs were done as well as a quantiferrin gold. I did not see any abnormalities on her chest x-ray. I am concerned of  possibly some of her symptoms may be secondary to anxiety. This may need to be addressed on her follow-up visit I personally performed the services described in this documentation, which was scribed in my presence. The recorded information has been reviewed and is accurate.

## 2014-08-25 LAB — COMPREHENSIVE METABOLIC PANEL
ALT: 15 U/L (ref 0–35)
AST: 21 U/L (ref 0–37)
Albumin: 4.7 g/dL (ref 3.5–5.2)
Alkaline Phosphatase: 62 U/L (ref 39–117)
BUN: 8 mg/dL (ref 6–23)
CHLORIDE: 101 meq/L (ref 96–112)
CO2: 28 mEq/L (ref 19–32)
CREATININE: 0.5 mg/dL (ref 0.50–1.10)
Calcium: 9.2 mg/dL (ref 8.4–10.5)
GLUCOSE: 90 mg/dL (ref 70–99)
POTASSIUM: 4 meq/L (ref 3.5–5.3)
Sodium: 138 mEq/L (ref 135–145)
Total Bilirubin: 0.4 mg/dL (ref 0.2–1.2)
Total Protein: 8.2 g/dL (ref 6.0–8.3)

## 2014-08-27 LAB — QUANTIFERON TB GOLD ASSAY (BLOOD)
Interferon Gamma Release Assay: NEGATIVE
Mitogen value: 4.12 IU/mL
QUANTIFERON TB AG MINUS NIL: 0 [IU]/mL
Quantiferon Nil Value: 0.07 IU/mL
TB Ag value: 0.07 IU/mL

## 2014-08-28 ENCOUNTER — Encounter: Payer: Self-pay | Admitting: Family Medicine

## 2014-09-01 ENCOUNTER — Other Ambulatory Visit (INDEPENDENT_AMBULATORY_CARE_PROVIDER_SITE_OTHER): Payer: BLUE CROSS/BLUE SHIELD

## 2014-09-01 DIAGNOSIS — E890 Postprocedural hypothyroidism: Secondary | ICD-10-CM | POA: Diagnosis not present

## 2014-09-01 LAB — T4, FREE: FREE T4: 0.91 ng/dL (ref 0.60–1.60)

## 2014-09-01 LAB — TSH: TSH: 1.15 u[IU]/mL (ref 0.35–4.50)

## 2014-09-02 ENCOUNTER — Other Ambulatory Visit: Payer: Self-pay | Admitting: Internal Medicine

## 2014-09-02 DIAGNOSIS — E890 Postprocedural hypothyroidism: Secondary | ICD-10-CM

## 2014-09-07 ENCOUNTER — Telehealth: Payer: Self-pay | Admitting: Internal Medicine

## 2014-09-07 NOTE — Telephone Encounter (Signed)
Please read her the message I sent her when I released her labs. Always check that first when pts have an active MyChart. OK to refill LT4.

## 2014-09-07 NOTE — Telephone Encounter (Signed)
Please read message below and advise.  

## 2014-09-07 NOTE — Telephone Encounter (Signed)
Patient states she had her labs and would like her results  Also she would like to know if the dosage had changed  She will going to her country for a few months    Please advise    Thank You

## 2014-09-08 MED ORDER — LEVOTHYROXINE SODIUM 88 MCG PO TABS: 88.0000 ug | ORAL_TABLET | Freq: Every day | ORAL | Status: DC

## 2014-09-08 NOTE — Telephone Encounter (Signed)
Called pt and spoke with pt's daughter. Advised her of her lab results. Advised her we would send rx refill of the levothyroxine for 90 days. Pt going out of the country.

## 2015-01-18 ENCOUNTER — Ambulatory Visit (INDEPENDENT_AMBULATORY_CARE_PROVIDER_SITE_OTHER): Payer: BLUE CROSS/BLUE SHIELD | Admitting: Internal Medicine

## 2015-01-18 ENCOUNTER — Encounter: Payer: Self-pay | Admitting: Internal Medicine

## 2015-01-18 VITALS — BP 128/70 | HR 78 | Temp 98.3°F | Resp 12 | Wt 143.8 lb

## 2015-01-18 DIAGNOSIS — E890 Postprocedural hypothyroidism: Secondary | ICD-10-CM

## 2015-01-18 DIAGNOSIS — C73 Malignant neoplasm of thyroid gland: Secondary | ICD-10-CM | POA: Diagnosis not present

## 2015-01-18 LAB — TSH: TSH: 2.1 u[IU]/mL (ref 0.35–4.50)

## 2015-01-18 LAB — T4, FREE: Free T4: 0.81 ng/dL (ref 0.60–1.60)

## 2015-01-18 NOTE — Progress Notes (Signed)
Patient ID: Michelle Hammond, female   DOB: 1970-10-23, 44 y.o.   MRN: [id number]   HPI  Michelle Hammond is a 44 y.o.-year-old female, returning for f/u for papillary thyroid cancer and postop hypothyroidism. Last visit 6 mo ago.  Reviewed hx: Pt. has been found to have 2 L thyroid nodules (one of 7 x 10 mm and one of 13 x 14 mm, no calcifications) on thyroid U/S 11/14/2013 in [place] >> had a thyroid nodule FNA on 11/30/2013 (unclear of which of the 2 nodules) >> papillary thyroid carcinoma. She brought records in [place]. The interpreter translated the reports for [place]. The word carcinoma was easily distinguishable on the report.  02/05/2014: total thyroidectomy by Dr Harlow Asa  Diagnosis 1. Thyroid, thyroidectomy - PAPILLARY THYROID CARCINOMA, THREE FOCI, 1.4, 0.3, AND 0.1 CM. - FOCAL VASCULAR INVOLVEMENT BY TUMOR. - FOCAL EXTRA THYROID EXTENSION. - MARGINS NOT INVOLVED. - SEE ONCOLOGY TABLE. 2. Lymph node, biopsy, central compartment - ONE BENIGN LYMPH NODE (0/1). Microscopic Comment 1. THYROID Specimen: Thyroid and central compartment lymph node. Procedure: Thyroidectomy and lymph node biopsy. Specimen Integrity (intact/fragmented): Focally disrupted. Tumor focality: Three foci of tumor, left lobe 1.4 cm, right lobe, 0.3 cm and 0.1 cm..  Dominant tumor: Left superior thyroid. Maximum tumor size (cm): 1.4 cm. Tumor laterality: Left. Histologic type (including subtype and/or unique features as applicable): Papillary carcinoma. Tumor capsule: N/A Extrathyroidal extension: Present. Margins: Free of tumor, invasive tumor focally less than 0.1 cm from inked margin. Lymph - Vascular invasion: Present, focal.  Second and additional tumors: Right lobe Tumor size(s): 0.3 and 0.1 cm Tumor laterality: Right. Histologic type (including subtype and/or unique features as applicable) : Papillary thyroid carcinoma. Tumor capsule: N/A Extrathyroidal extension: No. Margins: Free of tumor. Lymph  - Vascular invasion: No. Lymph nodes: # examined 1; # positive; 0 TNM code: pT3 , pN0 04/13/2014: RAI Tx 103 mCi I-131 04/21/2014: Post tx WBS: focal activity in thyroid bed only. Component     Latest Ref Rng 05/18/2014  Thyroglobulin     2.8 - 40.9 ng/mL 0.2 (L)  Thyroglobulin Ab     <2 IU/mL <1   She was started on Synthroid 88 mcg daily after the surgery - this had to be stopped for ~3 weeks to allow the TSH to increase for the remnant ablation. She felt poorly during the TSH increase, and is slowly improving after restarted the LT4. A subsequent TSH was 3.3 >> we increased the dose to to 100 mcg >> felt SOB, CP, increased appetite, hair loss. She switched to 88 mcg after 3 weeks >> feeling better >> we then changed to 88 mcg alternating with 100 mcg every other day >> TFTs normal, she feels well.  She takes the LT4: - fasting - w/ water  - b'fast 1-2 h later - + occasional PPI and MVI - late in the day - + Ca at night  I reviewed pt's thyroid tests: Lab Results  Component Value Date   TSH 1.15 09/01/2014   TSH 3.30 06/18/2014   TSH 7.36* 05/18/2014   TSH 50.82* 04/10/2014   TSH 12.45* 04/02/2014   TSH 4.24 03/20/2014   TSH 0.57 03/02/2014   TSH 0.47 12/16/2013   TSH 0.636 04/08/2010   FREET4 0.91 09/01/2014   FREET4 0.96 06/18/2014   FREET4 0.99 05/18/2014   FREET4 0.90 03/02/2014   FREET4 0.71 12/16/2013   FREET4 1.04 04/08/2010  2012: TSH 0.628  Pt describes: - + fatigue, needs to sleep a  lot - + weight gain: 4 lbs - + hot flushes - no palpitations - + SOB, + cough - no tremors - no weight gain - no constipation - no hair loss - no depression  I reviewed pt's medications, allergies, PMH, social hx, family hx, and changes were documented in the history of present illness. Otherwise, unchanged from my initial visit note.  ROS: Constitutional: + weight gain, + fatigue, + hot flushes,no nocturia Eyes: + blurry vision, no xerophthalmia ENT: no sore throat, no  nodules palpated in throat, no dysphagia/odynophagia, + hoarseness Cardiovascular: no CP/+ occas. SOB/no palpitations/ no leg swelling Respiratory: + occas. cough/+ occas. SOB Gastrointestinal: no N/no V/D/C/acid reflux Musculoskeletal: + both muscle/joint aches Skin: resolved tingling in thyroid scar area; + easy bruising, no hair loss Neurological: no tremors/+ R hand numnbessnumbness/tingling/dizziness, + HA  PE: BP 128/70 mmHg  Pulse 78  Temp(Src) 98.3 F (36.8 C) (Oral)  Resp 12  Wt 143 lb 12.8 oz (65.227 kg)  SpO2 97% Body mass index is 26.94 kg/(m^2).  Wt Readings from Last 3 Encounters:  01/18/15 143 lb 12.8 oz (65.227 kg)  08/24/14 139 lb 9.6 oz (63.322 kg)  07/21/14 138 lb (62.596 kg)   Constitutional: normalweight, in NAD Eyes: PERRLA, EOMI, no exophthalmos ENT: moist mucous membranes, no neck masses - thyroidectomy scan healed - no discomfort at the site; no cervical lymphadenopathy Cardiovascular: RRR, No MRG Respiratory: CTA B Gastrointestinal: abdomen soft, NT, ND, BS+ Musculoskeletal: no deformities, strength intact in all 4 Skin: moist, warm, no rashes Neurological: no tremor with outstretched hands, DTR normal in all 4  ASSESSMENT: 1. Papillary thyroid cancer  PLAN:  1. PTC - I discussed with the patient and her daughter about her recent thyroid labs: TSH was close to target, but we may need to increase the dose to 100 g. We need to repeat her thyroid tests in another month in this case. - reviewed her recent Tg, which was undetectable, which is great, and I explained that this is a "cancer marker"; if stays low, this is very reassuring. - will check thyroid tests in 4 weeks: TSH, free T4, also Tg + ATA - Needs refills when labs are back. - again advised to take the thyroid hormone every day, with water, >30 minutes before breakfast, separated by >4 hours from acid reflux medications, calcium, iron, multivitamins. She is taking it correctly now. - I plan  to repeat get a thyrogen-stim WBS in 1 year after her radioactive iodine treatment >> she will call me in 3 months so I can order this - Return in about 6 months (around 07/21/2015).   Office Visit on 01/18/2015  Component Date Value Ref Range Status  . TSH 01/18/2015 2.10  0.35 - 4.50 uIU/mL Final  . Free T4 01/18/2015 0.81  0.60 - 1.60 ng/dL Final  . Thyroglobulin Ab 01/18/2015 <1  <2 IU/mL Final  . Thyroglobulin 01/18/2015 0.1* 2.8 - 40.9 ng/mL Final   Comment: Thyroglobulin antibodies (TGAb) interfere with Thyroglobulin (TG) assays; therefore, Thyroglobulin antibody (TGAb) assay should always be performed in conjunction with a Thyroglobulin (TG) assay.   This test was performed using the Beckman Coulter chemiluminescent method.  Values obtained from different assay methods cannot be used interchangeably.  Thyroglobulin levels, regardless of value, should not be interpreted as absolute evidence of the presence or absence of disease.    Message sent: Dear Ms. Alfonse Spruce, The thyroglobulin and antithyroglobulin antibodies are great. The thyroid test, TSH, is still a little bit on  the high side for the first year after thyroidectomy. Let's try to increase the dose of your levothyroxine to 100 g every day. Please let me know how you feel on this and let's plan to recheck your thyroid tests in 6 weeks. Please call our main office number [phone]) to schedule a lab appointment.  Sincerely, Philemon Kingdom MD

## 2015-01-18 NOTE — Patient Instructions (Signed)
Please stop at the lab.  Please give me a call or message in 3 months to order the Whole Body Scan.  Please come back for a follow-up appointment in 6 months.

## 2015-01-19 LAB — THYROGLOBULIN ANTIBODY: Thyroglobulin Ab: <1 IU/mL (ref <2)

## 2015-01-19 LAB — THYROGLOBULIN LEVEL: THYROGLOBULIN: 0.1 ng/mL — AB (ref 2.8–40.9)

## 2015-01-19 MED ORDER — LEVOTHYROXINE SODIUM 100 MCG PO TABS: 100.0000 ug | ORAL_TABLET | Freq: Every day | ORAL | Status: DC

## 2015-03-16 ENCOUNTER — Ambulatory Visit (INDEPENDENT_AMBULATORY_CARE_PROVIDER_SITE_OTHER): Payer: BLUE CROSS/BLUE SHIELD

## 2015-03-16 ENCOUNTER — Other Ambulatory Visit: Payer: Self-pay | Admitting: Family Medicine

## 2015-03-16 ENCOUNTER — Ambulatory Visit (INDEPENDENT_AMBULATORY_CARE_PROVIDER_SITE_OTHER): Payer: BLUE CROSS/BLUE SHIELD | Admitting: Family Medicine

## 2015-03-16 VITALS — BP 120/80 | HR 91 | Temp 98.7°F | Resp 16 | Ht 62.0 in | Wt 148.0 lb

## 2015-03-16 DIAGNOSIS — IMO0001 Reserved for inherently not codable concepts without codable children: Secondary | ICD-10-CM

## 2015-03-16 DIAGNOSIS — C73 Malignant neoplasm of thyroid gland: Secondary | ICD-10-CM | POA: Diagnosis not present

## 2015-03-16 DIAGNOSIS — M791 Myalgia: Secondary | ICD-10-CM

## 2015-03-16 DIAGNOSIS — M2550 Pain in unspecified joint: Secondary | ICD-10-CM

## 2015-03-16 DIAGNOSIS — B789 Strongyloidiasis, unspecified: Secondary | ICD-10-CM | POA: Diagnosis not present

## 2015-03-16 DIAGNOSIS — E890 Postprocedural hypothyroidism: Secondary | ICD-10-CM | POA: Diagnosis not present

## 2015-03-16 DIAGNOSIS — M609 Myositis, unspecified: Secondary | ICD-10-CM

## 2015-03-16 DIAGNOSIS — Z23 Encounter for immunization: Secondary | ICD-10-CM | POA: Diagnosis not present

## 2015-03-16 LAB — POCT CBC
Granulocyte percent: 65.7 %G (ref 37–80)
HEMATOCRIT: 37.5 % — AB (ref 37.7–47.9)
HEMOGLOBIN: 12 g/dL — AB (ref 12.2–16.2)
LYMPH, POC: 1.9 (ref 0.6–3.4)
MCH, POC: 28 pg (ref 27–31.2)
MCHC: 31.9 g/dL (ref 31.8–35.4)
MCV: 87.7 fL (ref 80–97)
MID (cbc): 0.3 (ref 0–0.9)
MPV: 6.3 fL (ref 0–99.8)
POC GRANULOCYTE: 4.3 (ref 2–6.9)
POC LYMPH %: 29.5 % (ref 10–50)
POC MID %: 4.8 %M (ref 0–12)
Platelet Count, POC: 262 10*3/uL (ref 142–424)
RBC: 4.27 M/uL (ref 4.04–5.48)
RDW, POC: 13.1 %
WBC: 6.5 10*3/uL (ref 4.6–10.2)

## 2015-03-16 LAB — COMPREHENSIVE METABOLIC PANEL
ALK PHOS: 65 U/L (ref 33–115)
ALT: 15 U/L (ref 6–29)
AST: 19 U/L (ref 10–35)
Albumin: 4.1 g/dL (ref 3.6–5.1)
BILIRUBIN TOTAL: 0.3 mg/dL (ref 0.2–1.2)
BUN: 9 mg/dL (ref 7–25)
CO2: 26 mmol/L (ref 20–31)
CREATININE: 0.41 mg/dL — AB (ref 0.50–1.10)
Calcium: 8.7 mg/dL (ref 8.6–10.2)
Chloride: 104 mmol/L (ref 98–110)
GLUCOSE: 97 mg/dL (ref 65–99)
POTASSIUM: 3.8 mmol/L (ref 3.5–5.3)
Sodium: 140 mmol/L (ref 135–146)
TOTAL PROTEIN: 7.3 g/dL (ref 6.1–8.1)

## 2015-03-16 LAB — TSH: TSH: 0.218 u[IU]/mL — AB (ref 0.350–4.500)

## 2015-03-16 LAB — POCT SEDIMENTATION RATE: POCT SED RATE: 35 mm/h — AB (ref 0–22)

## 2015-03-16 LAB — MAGNESIUM: Magnesium: 2.1 mg/dL (ref 1.5–2.5)

## 2015-03-16 LAB — T4, FREE: FREE T4: 1.22 ng/dL (ref 0.80–1.80)

## 2015-03-16 LAB — CK: Total CK: 116 U/L (ref 7–177)

## 2015-03-16 LAB — VITAMIN B12: VITAMIN B 12: 1798 pg/mL — AB (ref 211–911)

## 2015-03-16 LAB — C-REACTIVE PROTEIN: CRP: <0.5 mg/dL (ref <0.60)

## 2015-03-16 MED ORDER — IVERMECTIN 3 MG PO TABS: 200.0000 ug/kg | ORAL_TABLET | Freq: Every day | ORAL | Status: AC

## 2015-03-16 MED ORDER — CYCLOBENZAPRINE HCL 10 MG PO TABS: 10.0000 mg | ORAL_TABLET | Freq: Three times a day (TID) | ORAL | Status: DC | PRN

## 2015-03-16 MED ORDER — MELOXICAM 15 MG PO TABS: 15.0000 mg | ORAL_TABLET | Freq: Every day | ORAL | Status: DC

## 2015-03-16 NOTE — Patient Instructions (Addendum)
Stop taking the omeprazole (or any medicine for your stomach ending in -azole) for at least 1-2 months.  You can use zantac (randitidine) or pepcid (famotidine) twice a day instead.  If you are having any stomach problems or heartburn you should consider being tested for Helicobactor pylori before restarting the omeprazole. Try taking the meloxicam every morning for several weeks at least.  Do not use with any other otc pain medication other than tylenol/acetaminophen - so no aleve, ibuprofen, motrin, advil, etc. Try the cyclobenzaprine every night to see if it helps you rest better and reduced your pain overall.   Fibromyalgia Fibromyalgia is a disorder that is often misunderstood. It is associated with muscular pains and tenderness that comes and goes. It is often associated with fatigue and sleep disturbances. Though it tends to be long-lasting, fibromyalgia is not life-threatening. CAUSES  The exact cause of fibromyalgia is unknown. People with certain gene types are predisposed to developing fibromyalgia and other conditions. Certain factors can play a role as triggers, such as:  Spine disorders.  Arthritis.  Severe injury (trauma) and other physical stressors.  Emotional stressors. SYMPTOMS   The main symptom is pain and stiffness in the muscles and joints, which can vary over time.  Sleep and fatigue problems. Other related symptoms may include:  Bowel and bladder problems.  Headaches.  Visual problems.  Problems with odors and noises.  Depression or mood changes.  Painful periods (dysmenorrhea).  Dryness of the skin or eyes. DIAGNOSIS  There are no specific tests for diagnosing fibromyalgia. Patients can be diagnosed accurately from the specific symptoms they have. The diagnosis is made by determining that nothing else is causing the problems. TREATMENT  There is no cure. Management includes medicines and an active, healthy lifestyle. The goal is to enhance physical  fitness, decrease pain, and improve sleep. HOME CARE INSTRUCTIONS   Only take over-the-counter or prescription medicines as directed by your caregiver. Sleeping pills, tranquilizers, and pain medicines may make your problems worse.  Low-impact aerobic exercise is very important and advised for treatment. At first, it may seem to make pain worse. Gradually increasing your tolerance will overcome this feeling.  Learning relaxation techniques and how to control stress will help you. Biofeedback, visual imagery, hypnosis, muscle relaxation, yoga, and meditation are all options.  Anti-inflammatory medicines and physical therapy may provide short-term help.  Acupuncture or massage treatments may help.  Take muscle relaxant medicines as suggested by your caregiver.  Avoid stressful situations.  Plan a healthy lifestyle. This includes your diet, sleep, rest, exercise, and friends.  Find and practice a hobby you enjoy.  Join a fibromyalgia support group for interaction, ideas, and sharing advice. This may be helpful. SEEK MEDICAL CARE IF:  You are not having good results or improvement from your treatment. FOR MORE INFORMATION  National Fibromyalgia Association: www.fmaware.[place]: www.arthritis.org Document Released: 06/26/2005 Document Revised: 09/18/2011 Document Reviewed: 10/06/2009 [facility], [facility], [facility] Patient Information 2015 [place], [place]. This information is not intended to replace advice given to you by your health care provider. Make sure you discuss any questions you have with your health care provider.

## 2015-03-16 NOTE — Progress Notes (Addendum)
Subjective:  This chart was scribed for  Michelle Cheadle MD,  by Tamsen Roers, at [facility].  This patient was seen in room 9 and the patient's care was started at 2:06 PM.    Patient ID: Michelle Hammond, female    DOB: 1971/01/27, 44 y.o.   MRN: [id number] Chief Complaint  Patient presents with  . Generalized Body Aches    HPI HPI Comments: Michelle Hammond is a 44 y.o. female who presents to the [facility] complaining of worsening weakness/pain in her thighs/ knees, neck/shoulder/arms. She states that she has been having swelling in her knees as well which she tries to massage often.  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 She also feels like the chemicals at work seems to irritate her breathing and causes her to have shortness of breath.  Patient states that she has had this issue for a long time and it was stable but it feels like it is now worse.  She is also complaining of congestion, headache and an increase in weight gain.  She still has heart burn. She has associated symptoms of fatigue and states and would like to have a blood test.  She would also like a flu shot today.  Patient would like to be checked for osteoporosis as her knees feel very weak. She is complaint with her Calcium, biotin  fish oil, and takes Nexium occasionally. She no longer takes Cymbalta, or Lodine.  She has tried diet changes in the past.  When she was in [place], there were times when patient had the complaints and feels that her symptoms vary.    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 She states that she has bowel movements frequently.  Patient sates that she had itching all over but no longer has those symptoms.   Patient states that she has intermittent rectum pain as well.      Hepatitis:  Patient is an asymptomatic Hep B Carrier.  Patient had blood work done and states that they told her that her virus is not "active".  ----- She is a patient my Dr. Marin Comment. approximately 1 year ago 01/2014, she had a total thyroidectomy by dr. Cruzita Lederer and had a foci of papillary thyroid carcinoma which did show vascular involvement and extra thyroid extension.   Lymph node biopsy was benign and she has been followed by Dr.Gherghe. Miss Janis's levothyroxine was increased from 88 to 100 6 weeks prior.    She is due for re-check of this, future labs have bene ordered.  She last had other lab work 6 months prior which was normal.  Patient also has hepatitis B.  Normal B 12 and D one year ago.  She has tried cyclobenzaprine in the past but was put on Nortriptyline last November by Dr. Krista Blue which she stopped after 1 month 20 mg.     Patient Active Problem List   Diagnosis Date Noted  . 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 Paresthesia of right leg 05/19/2014  . Headache 05/19/2014  . 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 Papillary thyroid carcinoma 02/05/2014  . Bronchial asthma 04/29/2013  . Seasonal allergies 04/29/2013  . PCB (post coital bleeding) 10/18/2012   Past Medical History  Diagnosis Date  .  Hepatitis B   . Thyroid disease     SOMETIMES FAST HEART BEAT, MUSCLES TINGLE, SOMETIMES HARD TO SWALLOW,  AND FEELS LIKE NEEDS TO CLEAR THROAT  . Allergy     SEASONAL   . Cancer     Thyroid cancer   . Numbness     WHOLE RIGHT SIDE OF BODY FEELS NUMB AND PAINFUL,  AND PAIN LEFT SIDE OF BODY PAINFUL - BUT NO NUMBNESS LEFT SIDE - STATES SHE HAS HAD PROBLEM FOR YEARS- DOES NOT KNOW THE CAUSE OF HER NUMBNESS AND PAIN  . Shortness of breath   . Asthma     NO LONGER USING INHALER  . GERD (gastroesophageal reflux disease)     EVERY TIME I EAT I HAVE BURPING, INDIGESTION AND DIARRHEA  . Hemorrhoids     PAINFUL AND SOMETIMES BLEEDING  . Bruises easily    Past Surgical History   Procedure Laterality Date  . No previous surgery    . Thyroidectomy N/A 02/05/2014    Procedure: TOTAL THYROIDECTOMY ;  Surgeon: Earnstine Regal, MD;  Location: [facility];  Service: General;  Laterality: N/A;  . Lymph node dissection N/A 02/05/2014    Procedure: CENTRAL LYMPH NODE DISSECTION;  Surgeon: Earnstine Regal, MD;  Location: [facility];  Service: General;  Laterality: N/A;   Allergies  Allergen Reactions  . Aspirin Other (See Comments)    Easily bruising   Prior to Admission medications   Medication Sig Start Date End Date Taking? Authorizing Provider  calcium carbonate (OS-CAL - DOSED IN MG OF ELEMENTAL CALCIUM) 1250 MG tablet Take 2 tablets (1,000 mg of elemental calcium total) by mouth 2 (two) times daily with a meal. 02/06/14  Yes Armandina Gemma, MD  Cholecalciferol (VITAMIN D-3) 5000 UNITS TABS Take 1 tablet by mouth daily.   Yes Historical Provider, MD  ibuprofen (ADVIL,MOTRIN) 600 MG tablet  05/15/14  Yes Historical Provider, MD  levothyroxine (SYNTHROID, LEVOTHROID) 100 MCG tablet Take 1 tablet (100 mcg total) by mouth daily. 01/19/15  Yes Philemon Kingdom, MD  metoprolol succinate (TOPROL-XL) 25 MG 24 hr tablet Take 1 tablet (25 mg total) by mouth daily. 08/24/14  Yes Darlyne Russian, MD  Misc Natural Products (OSTEO BI-FLEX ADV TRIPLE ST) TABS Take 2 tablets by mouth daily.   Yes Historical Provider, MD  Multiple Vitamin (MULTIVITAMIN WITH MINERALS) TABS tablet Take 1 tablet by mouth daily.   Yes Historical Provider, MD  OVER THE COUNTER MEDICATION PREPARATION H SUPPOSITORY  ONCE A DAY AT NIGHT   Yes Historical Provider, MD  cetirizine (ZYRTEC) 10 MG tablet Take 10 mg by mouth daily. PT STATES HER BRAND IS ALLER-TEC    Historical Provider, MD  Chlorphen-Pseudoephed-APAP (TYLENOL ALLERGY SINUS PO) Take 1 tablet by mouth daily.    Historical Provider, MD  Diclofenac Sodium 3 % GEL Apply to right arm, shoulder as needed. Patient not taking: Reported on 03/16/2015 06/11/14   Marcial Pacas, MD   DULoxetine (CYMBALTA) 30 MG capsule One po q day xone week, then 2 tabs po qday 06/11/14   Marcial Pacas, MD  esomeprazole (NEXIUM) 40 MG capsule Take 1 capsule (40 mg total) by mouth daily. Patient not taking: Reported on 03/16/2015 04/28/14   Thao P Le, DO  etodolac (LODINE) 400 MG tablet Take 400 mg by mouth daily. 04/28/14   Historical Provider, MD  glucosamine-chondroitin 500-400 MG tablet Take 1 tablet by mouth 2 (two) times daily.    Historical Provider, MD  HYDROcodone-acetaminophen (NORCO/VICODIN) 5-325 MG  per tablet Take 1-2 tablets by mouth every 4 (four) hours as needed for moderate pain. Patient not taking: Reported on 01/18/2015 02/06/14   Armandina Gemma, MD   Social History   Social History  . Marital Status: Divorced    Spouse Name: N/A  . Number of Children: 1  . Years of Education: 12   Occupational History  . Not on file.   Social History Main Topics  . Smoking status: Never Smoker   . Smokeless tobacco: Never Used  . Alcohol Use: No  . Drug Use: No  . Sexual Activity: Not Currently    Birth Control/ Protection: None   Other Topics Concern  . Not on file   Social History Narrative   Patient lives at home alone. Single.   Patient is not working.   Education high school.   Caffeine none    Right handed.   Review of Systems  Constitutional: Positive for fatigue and unexpected weight change.  HENT: Positive for congestion.   Eyes: Negative for pain, discharge and itching.  Respiratory: Negative for cough and shortness of breath.   Gastrointestinal: Positive for rectal pain. Negative for nausea, vomiting, diarrhea and constipation.  Musculoskeletal: Positive for myalgias, joint swelling and neck pain. Negative for neck stiffness.  Neurological: Positive for weakness and headaches. Negative for syncope and speech difficulty.       Objective:   Physical Exam  Constitutional: She is oriented to person, place, and time. She appears well-developed. No distress.   HENT:  Head: Normocephalic and atraumatic.  Eyes: Pupils are equal, round, and reactive to light.  Neck:  No adenopathy or masses.   Cardiovascular: Normal rate, regular rhythm, S1 normal, S2 normal and normal heart sounds.  Exam reveals no friction rub.   No murmur heard. Pulmonary/Chest: Effort normal and breath sounds normal. No respiratory distress. She has no wheezes. She has no rales.  Musculoskeletal:  bilateral proximal muscles 4 /5  Upper and lower extremity distal muscles 5/5 No point tenderness over the olecranon and epicondyles but pos point tenderness around upper costosternal.    Lymphadenopathy:    She has no cervical adenopathy.  Neurological: She is alert and oriented to person, place, and time.  Reflex Scores:      Tricep reflexes are 2+ on the right side and 2+ on the left side.      Bicep reflexes are 2+ on the right side and 2+ on the left side.      Brachioradialis reflexes are 2+ on the right side and 2+ on the left side.      Patellar reflexes are 2+ on the right side and 2+ on the left side. Skin: Skin is warm and dry.  Psychiatric: She has a normal mood and affect. Her behavior is normal.  Nursing note and vitals reviewed.  Filed Vitals:   03/16/15 1334  BP: 120/80  Pulse: 91  Temp: 98.7 F (37.1 C)  TempSrc: Oral  Resp: 16  Height: 5\' 2"  (1.575 m)  Weight: 148 lb (67.132 kg)  SpO2: 98%   [facility] (PRIMARY) x-ray report read by Dr. Brigitte Pulse:  Bilateral Knees: normal.   Dg Knee 1-2 Views Left  03/16/2015   CLINICAL DATA:  Knee pain.  EXAM: LEFT KNEE - 1-2 VIEW  COMPARISON:  None.  FINDINGS: No evidence of acute fracture or subluxation. No focal bone lesion or bone destruction. Bone cortex and trabecular architecture appear intact. No radiopaque soft tissue foreign bodies.  IMPRESSION: No acute osseous  abnormality identified.   Electronically Signed   By: Fidela Salisbury M.D.   On: 03/16/2015 16:04   Dg Knee 1-2 Views Right  03/16/2015   CLINICAL DATA:   Right knee pain of unknown origin or duration. No known injury. Initial encounter.  EXAM: RIGHT KNEE - 1-2 VIEW  COMPARISON:  None.  FINDINGS: There is no evidence of fracture, dislocation, or joint effusion. There is no evidence of arthropathy or other focal bone abnormality. Soft tissues are unremarkable.  IMPRESSION: Negative exam.   Electronically Signed   By: Inge Rise M.D.   On: 03/16/2015 15:37       Assessment & Plan:   1. Postsurgical hypothyroidism - by dr. Harlow Asa  2. Papillary thyroid carcinoma - followed by Dr. Cruzita Lederer - pt due for repeat thyroid labs so will obtain and forward to Dr. Cruzita Lederer; also was told she needs to endocrine to have a "full body scan" ordered which she would like to proceed with now. Pt requests that we contact enocrine to get this arranged. She has f/u OV sched w/ D.r Cruzita Lederer sched for 07/2015  3. Strongyloidosis - diagnosed in [place] - copy of handwritten results on a printed medical form brought in - apparently pt was told that ivermectin was a better treatment but they didn't have that med avail in [place] so she was treated with a similar unknown med..  Given O&P to take home - if negative, may want to consider recurrent collection for at least 3 samples - but even this can have negligible sensitivity so will try to get strongyloidosis antibodies as well.  Will go ahead and treat with ivermectin 200 mcg/kg/d x 2d since diagnosis can be difficult to make and med has no sig co-morbidities or interactions or sig side effects for pt.  4. Myalgia and myositis  - suspect fibromyalgia through failed nortriptyline 20 w/in the past year so check labs and start therapeutic trial of flexeril qhs. Detailed lab w/u P. Discussed w/ pt that she may want to try elimination diets to see if she can get improvement off of common sensitivities such as lactose or gluten. 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  Could be PMR so check labs  5.  Arthralgia - could be due to Hep B but suspect this is more a fibromyalgia type syndrome vs OA- do therapeutic trial of meloxicam qam and cyclobenzaprine qhs. Very unlikely but could be chronic Hep B infection stimulating arthritis of immune complex mediated disease.  Which would be simlar to polyarteritis nodosa. golmerulonephritis and essential mixed cryoglobulinemia.   6. 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  Orders Placed This Encounter  Procedures  . Ova and parasite examination  . DG Knee 1-2 Views Left    standing    Standing Status: Future     Number of Occurrences: 1     Standing Expiration Date: 03/15/2016    Order Specific Question:  Reason for Exam (SYMPTOM  OR DIAGNOSIS REQUIRED)    Answer:  pain    Order Specific Question:  Is the patient pregnant?    Answer:  No    Order Specific Question:  Preferred imaging location?    Answer:  External  . DG  Knee 1-2 Views Right    standing    Standing Status: Future     Number of Occurrences: 1     Standing Expiration Date: 03/15/2016    Order Specific Question:  Reason for Exam (SYMPTOM  OR DIAGNOSIS REQUIRED)    Answer:  pain    Order Specific Question:  Is the patient pregnant?    Answer:  No    Order Specific Question:  Preferred imaging location?    Answer:  External  . Flu Vaccine QUAD 36+ mos IM  . Comprehensive metabolic panel  . Vitamin B12  . Vit D  25 hydroxy (rtn osteoporosis monitoring)  . C-reactive protein  . ANA  . CK  . Magnesium  . T4, free  . TSH  . POCT CBC  . POCT SEDIMENTATION RATE    Meds ordered this encounter  Medications  . cyclobenzaprine (FLEXERIL) 10 MG tablet    Sig: Take 1 tablet (10 mg total) by mouth 3 (three) times daily as needed for muscle spasms.    Dispense:  30 tablet    Refill:  0  . meloxicam (MOBIC)  15 MG tablet    Sig: Take 1 tablet (15 mg total) by mouth daily.    Dispense:  30 tablet    Refill:  1  . ivermectin (STROMECTOL) 3 MG TABS tablet    Sig: Take 4.5 tablets (13,500 mcg total) by mouth daily.    Dispense:  9 tablet    Refill:  0   Over 40 min spent in face-to-face evaluation of and consultation with patient and coordination of care.  Over 50% of this time was spent counseling this patient.  I personally performed the services described in this documentation, which was scribed in my presence. The recorded information has been reviewed and considered, and addended by me as needed.  Michelle Cheadle, MD MPH

## 2015-03-17 ENCOUNTER — Other Ambulatory Visit: Payer: Self-pay | Admitting: Internal Medicine

## 2015-03-17 DIAGNOSIS — C73 Malignant neoplasm of thyroid gland: Secondary | ICD-10-CM

## 2015-03-17 LAB — ANTI-NUCLEAR AB-TITER (ANA TITER): ANA TITER 1: NEGATIVE (ref <1:40)

## 2015-03-17 LAB — ANA: Anti Nuclear Antibody(ANA): POSITIVE — AB

## 2015-03-17 LAB — VITAMIN D 25 HYDROXY (VIT D DEFICIENCY, FRACTURES): Vit D, 25-Hydroxy: 33 ng/mL (ref 30–100)

## 2015-03-18 LAB — OVA AND PARASITE EXAMINATION: OP: NONE SEEN

## 2015-03-23 LAB — STRONGYLOIDES ANTIBODY: STRONGYLOIDES IGG ANTIBODY, ELISA: NEGATIVE

## 2015-04-19 ENCOUNTER — Encounter (HOSPITAL_COMMUNITY)
Admission: RE | Admit: 2015-04-19 | Discharge: 2015-04-19 | Disposition: A | Discharge status: Home / Self Care | Payer: BLUE CROSS/BLUE SHIELD | Source: Ambulatory Visit | Attending: Internal Medicine | Admitting: Internal Medicine

## 2015-04-19 ENCOUNTER — Telehealth: Payer: Self-pay | Admitting: Internal Medicine

## 2015-04-19 DIAGNOSIS — C73 Malignant neoplasm of thyroid gland: Secondary | ICD-10-CM | POA: Diagnosis present

## 2015-04-19 MED ORDER — THYROTROPIN ALFA 1.1 MG IM SOLR
0.9000 mg | INTRAMUSCULAR | Status: AC
  Administered 2015-04-19: 0.9 mg via INTRAMUSCULAR

## 2015-04-19 MED ORDER — THYROTROPIN ALFA 1.1 MG IM SOLR
INTRAMUSCULAR | Status: AC
  Filled 2015-04-19: qty 0.9

## 2015-04-19 NOTE — Telephone Encounter (Signed)
Pt wants to know if she needs to do a low iodine diet this time. Advised pt that I did not believe so, but would check with Dr Cruzita Lederer and let her know. Please advise.

## 2015-04-19 NOTE — Telephone Encounter (Signed)
Patient called stating that she wants to speak with Larene Beach regarding her iodine treatment    Please advise   Thank You

## 2015-04-20 ENCOUNTER — Ambulatory Visit (HOSPITAL_COMMUNITY)
Admission: RE | Admit: 2015-04-20 | Discharge: 2015-04-20 | Disposition: A | Discharge status: Home / Self Care | Payer: BLUE CROSS/BLUE SHIELD | Source: Ambulatory Visit | Attending: Internal Medicine | Admitting: Internal Medicine

## 2015-04-20 ENCOUNTER — Other Ambulatory Visit: Payer: Self-pay | Admitting: Internal Medicine

## 2015-04-20 ENCOUNTER — Encounter (HOSPITAL_COMMUNITY): Admission: RE | Admit: 2015-04-20 | Payer: BLUE CROSS/BLUE SHIELD | Source: Ambulatory Visit

## 2015-04-20 DIAGNOSIS — C73 Malignant neoplasm of thyroid gland: Secondary | ICD-10-CM

## 2015-04-20 MED ORDER — THYROTROPIN ALFA 1.1 MG IM SOLR
0.9000 mg | INTRAMUSCULAR | Status: AC
  Administered 2015-04-20: 0.9 mg via INTRAMUSCULAR

## 2015-04-20 MED ORDER — THYROTROPIN ALFA 1.1 MG IM SOLR
INTRAMUSCULAR | Status: AC
  Filled 2015-04-20: qty 0.9

## 2015-04-20 NOTE — Telephone Encounter (Signed)
No need for the low iodine diet this time.

## 2015-04-20 NOTE — Telephone Encounter (Signed)
Called pt and advised her per Dr Gherghe's message. Pt voiced understanding.  

## 2015-04-21 ENCOUNTER — Ambulatory Visit (HOSPITAL_COMMUNITY)
Admission: RE | Admit: 2015-04-21 | Discharge: 2015-04-21 | Disposition: A | Discharge status: Home / Self Care | Payer: BLUE CROSS/BLUE SHIELD | Source: Ambulatory Visit | Attending: Internal Medicine | Admitting: Internal Medicine

## 2015-04-21 ENCOUNTER — Encounter (HOSPITAL_COMMUNITY): Payer: BLUE CROSS/BLUE SHIELD

## 2015-04-21 DIAGNOSIS — C73 Malignant neoplasm of thyroid gland: Secondary | ICD-10-CM

## 2015-04-21 LAB — HCG, SERUM, QUALITATIVE: Preg, Serum: NEGATIVE

## 2015-04-21 MED ORDER — SODIUM IODIDE I 131 CAPSULE
4.1600 | Freq: Once | INTRAVENOUS | Status: AC | PRN
Start: 2015-04-21 — End: 2015-04-21
  Administered 2015-04-21: 4.16 via ORAL

## 2015-04-22 ENCOUNTER — Other Ambulatory Visit: Payer: Self-pay | Admitting: Internal Medicine

## 2015-04-22 DIAGNOSIS — C73 Malignant neoplasm of thyroid gland: Secondary | ICD-10-CM

## 2015-04-23 ENCOUNTER — Ambulatory Visit (HOSPITAL_COMMUNITY)
Admission: RE | Admit: 2015-04-23 | Discharge: 2015-04-23 | Disposition: A | Discharge status: Home / Self Care | Payer: BLUE CROSS/BLUE SHIELD | Source: Ambulatory Visit | Attending: Internal Medicine | Admitting: Internal Medicine

## 2015-04-23 ENCOUNTER — Encounter (HOSPITAL_COMMUNITY): Payer: BLUE CROSS/BLUE SHIELD

## 2015-04-23 DIAGNOSIS — C73 Malignant neoplasm of thyroid gland: Secondary | ICD-10-CM

## 2015-04-28 ENCOUNTER — Ambulatory Visit (INDEPENDENT_AMBULATORY_CARE_PROVIDER_SITE_OTHER): Payer: BLUE CROSS/BLUE SHIELD | Admitting: Family Medicine

## 2015-04-28 VITALS — BP 128/90 | HR 87 | Temp 98.7°F | Resp 16 | Ht 61.0 in | Wt 141.8 lb

## 2015-04-28 DIAGNOSIS — R1310 Dysphagia, unspecified: Secondary | ICD-10-CM | POA: Diagnosis not present

## 2015-04-28 DIAGNOSIS — B9689 Other specified bacterial agents as the cause of diseases classified elsewhere: Secondary | ICD-10-CM

## 2015-04-28 DIAGNOSIS — N760 Acute vaginitis: Secondary | ICD-10-CM

## 2015-04-28 DIAGNOSIS — A499 Bacterial infection, unspecified: Secondary | ICD-10-CM | POA: Diagnosis not present

## 2015-04-28 DIAGNOSIS — R197 Diarrhea, unspecified: Secondary | ICD-10-CM

## 2015-04-28 DIAGNOSIS — M25559 Pain in unspecified hip: Secondary | ICD-10-CM

## 2015-04-28 DIAGNOSIS — K219 Gastro-esophageal reflux disease without esophagitis: Secondary | ICD-10-CM | POA: Diagnosis not present

## 2015-04-28 DIAGNOSIS — B9681 Helicobacter pylori [H. pylori] as the cause of diseases classified elsewhere: Secondary | ICD-10-CM

## 2015-04-28 DIAGNOSIS — K2970 Gastritis, unspecified, without bleeding: Secondary | ICD-10-CM

## 2015-04-28 LAB — HEMOCCULT GUIAC POC 1CARD (OFFICE): Fecal Occult Blood, POC: NEGATIVE

## 2015-04-28 LAB — POC MICROSCOPIC URINALYSIS (UMFC): MUCUS RE: ABSENT

## 2015-04-28 LAB — POCT URINALYSIS DIP (MANUAL ENTRY)
Bilirubin, UA: NEGATIVE
GLUCOSE UA: NEGATIVE
Ketones, POC UA: NEGATIVE
Leukocytes, UA: NEGATIVE
NITRITE UA: NEGATIVE
Protein Ur, POC: NEGATIVE
Spec Grav, UA: 1.01
UROBILINOGEN UA: 0.2
pH, UA: 7

## 2015-04-28 LAB — POCT WET + KOH PREP
Trich by wet prep: ABSENT
YEAST BY WET PREP: ABSENT
Yeast by KOH: ABSENT

## 2015-04-28 MED ORDER — OMEPRAZOLE 40 MG PO CPDR: 40.0000 mg | DELAYED_RELEASE_CAPSULE | Freq: Every day | ORAL | Status: DC

## 2015-04-28 MED ORDER — METRONIDAZOLE 500 MG PO TABS: 500.0000 mg | ORAL_TABLET | Freq: Two times a day (BID) | ORAL | Status: DC

## 2015-04-28 MED ORDER — HYDROCORTISONE ACETATE 25 MG RE SUPP: 25.0000 mg | Freq: Two times a day (BID) | RECTAL | Status: AC

## 2015-04-28 MED ORDER — GI COCKTAIL ~~LOC~~: 30.0000 mL | Freq: Once | ORAL | Status: DC

## 2015-04-28 NOTE — Patient Instructions (Addendum)
H?i ch?ng ru?t kch thch, Ng??i l?n (Irritable Bowel Syndrome, Adult) H?i ch?ng ru?t kch thch (IBS) khng ph?i l m?t b?nh c? th?. ?y l m?t nhm cc tri?u ch?ng ?nh h??ng ??n cc c? quan ph? trch tiu ha (h? tiu ha ho?c ???ng tiu ha).  ?? ?i?u ch?nh ???ng tiu ha ho?t ??ng, c? th? qu v? g?i cc tn hi?u qua l?i gi?a ru?t v no c?a qu v?. N?u qu v? b? IBS, s? c th? c v?n ?? v?i nh?ng tn hi?u ny. K?t qu? l, ???ng tiu ha khng ho?t ??ng bnh th??ng. Ru?t c?a qu v? c th? nh?y c?m h?n v ph?n ?ng qu m?c v?i m?t s? th? nh?t ??nh. ?i?u ny ??c bi?t ?ng khi qu v? ?n m?t s? lo?i th?c ?n nh?t ??nh ho?c khi qu v? b? c?ng th?ng.  C b?n lo?i IBS. Nh?ng lo?i ny c th? ???c xc ??nh d?a vo tnh tr?ng phn c?a qu v?:   IBS km tiu ch?y.  IBS km to bn.  IBS h?n h?p.  IBS khng phn lo?i. ?i?u quan tr?ng l ph?i bi?t qu v? b? lo?i IBS no. M?t s? bi?n php ?i?u tr? th??ng h?u ch h?n ??i v?i m?t s? lo?i IBS.  NGUYN NHN  Nguyn nhn chnh xc gy IBS khng ???c bi?t ??n. CC Y?U T? NGUY C? Qu v? c th? c nguy c? b? IBS cao h?n n?u:  Qu v? l ph? n?.  Qu v? d??i 43 tu?i.  Qu v? c ti?n s? gia ?nh b? IBS.  Qu v? c cc v?n ?? v? s?c kh?e tm th?n.  Qu v? t?ng b? nhi?m khu?n ???ng tiu ha. D?U HI?U V TRI?U CH?NG  Tri?u ch?ng IBS ? m?i ng??i m?t khc. Tri?u ch?ng chnh l ?au b?ng ho?c c?m gic kh ch?u trong b?ng. Nh?ng tri?u ch?ng khc th??ng l m?t trong nh?ng tri?u ch?ng sau:   Tiu ch?y, to bn, ho?c c? hai.  S?ng ho?c ch??ng b?ng.  C?m th?y ??y b?ng ho?c kh ch?u sau khi ?n m?t b?a ?n nh? ho?c b?a ?n bnh th??ng.  Th??ng xuyn ?nh h?i.  D?ch nh?y trong phn.  C?m gic v?n cn phn sau khi ?i ngoi. Cc tri?u ch?ng c xu h??ng xu?t hi?n v h?t. Cc tri?u ch?ng ny c th? lin quan ??n c?ng th?ng, tnh tr?ng tm th?n, ho?c ch?ng g c?.  CH?N ?ON  Khng c xt nghi?m ??c bi?t no ?? ch?n ?on IBS. Chuyn gia ch?m [place] s?c kh?e c?a qu v? s?  ch?n ?on d?a vo khm th?c th?, xem b?nh s? v cc tri?u ch?ng c?a qu v?. Qu v? c th? ph?i lm cc ki?m tra khc ?? lo?i tr? cc tnh tr?ng khc c th? gy ra cc tri?u ch?ng c?a qu v?. Nh?ng ki?m tra ny c th? bao g?m:   Xt nghi?m mu.  Ch?p X quang.  Ch?p CT.  N?i soi, soi ??i trng. ?y l m?t bi?n php ki?m tra trong ? ???ng tiu ha c?a qu v? ???c xem xt qua m?t ?ng di, nh? v m?m. ?I?U TR? Khng c thu?c ch?a kh?i IBS, nh?ng vi?c ?i?u tr? c th? gip gi?m cc tri?u ch?ng. ?i?u tr? IBS th??ng bao g?m:   Thay ??i ch? ?? ?n, ch?ng h?n:  ?n thm nhi?u ch?t x?.  Trnh cc lo?i th?c ?n gy ra tri?u ch?ng.  U?ng nhi?u n??c h?n.  ?n cc b?a th??ng xuyn v c kh?u ph?n trung bnh.  Thu?c.  Nh?ng lo?i thu?c ny c th? bao g?m:  B? sung ch?t x? n?u qu v? b? to bn.  Thu?c ki?m sot tiu ch?y (thu?c ch?ng tiu ch?y).  Thu?c gip ki?m sot s? co th?t c? ? ???ng tiu hoa (thu?c ch?ng co th?t).  Cc lo?i thu?c c tc d?ng v?i b?t k? v?n ?? s?c kh?e tm th?n no, ch?ng h?n nh? thu?c ch?ng tr?m c?m ho?c thu?c an th?n.  Li?u php.  Ni chuy?n v?i chuyn gia tr? li?u c th? gip gi?m c?ng th?ng, tr?m c?m, ho?c nh?ng v?n ?? s?c kh?e tm th?n khc c th? lm cc tri?u ch?ng IBS tr?m tr?ng h?n.  Gi?m c?ng th?ng.  Qu?n l c?ng th?ng c th? gip ki?m sot cc tri?u ch?ng. H??NG D?N CH?M [place] T?I NH   Ch? s? d?ng thu?c theo ch? d?n c?a chuyn gia ch?m [place] s?c kh?e.  p d?ng ch? ?? ?n u?ng kh?e m?nh.  Trnh cc lo?i th?c ?n v ?? u?ng c ???ng.  B? sung thm d?n d?n ng? c?c nguyn h?t, tri cy, rau vo ch? ?? ?n c?a qu v?. Vi?c ny c th? ??c bi?t h?u ch n?u qu v? b? IBS km to bn.  Trnh b?t k? th?c ?n hay ?? u?ng no lm cc tri?u ch?ng c?a qu v? tr?m tr?ng h?n. Nh?ng th? ny bao g?m cc s?n ph?m s?a v cc ?? u?ng c ga v caffein.  Khng ?n qu no.  U?ng ?? n??c ?? gi? cho n??c ti?u trong ho?c c mu vng nh?t.  T?p th? d?c th??ng xuyn. H?i chuyn gia ch?m  [place] s?c kh?e ?? nh?n l?i khuyn v? m?t vi ho?t ??ng t?t cho qu v?.  Tun th? m?i cu?c h?n khm l?i theo ch? d?n c?a chuyn gia ch?m [place] s?c kh?e. ?i?u ny c vai tr quan tr?ng. ?I KHM N?U:   Qu v? b? ?au lin t?c.  Qu v? th?y kh nu?t ho?c ?au khi nu?t.  Qu v? b? tiu ch?y tr?m tr?ng h?n. NGAY L?P T?C ?I KHM N?U:   Qu v? b? ?au b?ng nhi?u v tr?m tr?ng h?n.  Qu v? b? tiu ch?y v:  Qu v? b? pht ban, c?ng c?, ho?c ?au ??u d? d?i.  Qu v? d? b? kch thch, bu?n ng?, ho?c kh t?nh d?y.  Qu v? b? y?u, chng m?t, ho?c r?t kht.  Qu v? ?i ngoi c mu ?? t??i trong phn ho?c phn c mu ?en nh? h?c n.  Qu v? b? s?ng n? b?t th??ng ? b?ng v c?m th?y ?au.  Qu v? nn m?a lin t?c.  Qu v? nn ra mu (nn ra mu).  Qu v? v?a ?au b?ng v?a b? s?t.   Thng tin ny khng nh?m m?c ?ch thay th? cho l?i khuyn m chuyn gia ch?m [place] s?c kh?e ni v?i qu v?. Hy b?o ??m qu v? ph?i th?o lu?n b?t k? v?n ?? g m qu v? c v?i chuyn gia ch?m  s?c kh?e c?a qu v?.   Document Released: 06/26/2005 Document Revised: 07/17/2014 Elsevier Interactive Patient Education 2016 [place]. Diet for Irritable Bowel Syndrome When you have irritable bowel syndrome (IBS), the foods you eat and your eating habits are very important. IBS may cause various symptoms, such as abdominal pain, constipation, or diarrhea. Choosing the right foods can help ease discomfort caused by these symptoms. Work with your health care provider and dietitian to find the best eating plan to help control your symptoms. WHAT GENERAL  GUIDELINES DO I NEED TO FOLLOW?  Keep a food diary. This will help you identify foods that cause symptoms. Write down:  What you eat and when.  What symptoms you have.  When symptoms occur in relation to your meals.  Avoid foods that cause symptoms. Talk with your dietitian about other ways to get the same nutrients that are in these foods.  Eat more foods that contain  fiber. Take a fiber supplement if directed by your dietitian.  Eat your meals slowly, in a relaxed setting.  Aim to eat 5-6 small meals per day. Do not skip meals.  Drink enough fluids to keep your urine clear or pale yellow.  Ask your health care provider if you should take an over-the-counter probiotic during flare-ups to help restore healthy gut bacteria.  If you have cramping or diarrhea, try making your meals low in fat and high in carbohydrates. Examples of carbohydrates are pasta, rice, whole grain breads and cereals, fruits, and vegetables.  If dairy products cause your symptoms to flare up, try eating less of them. You might be able to handle yogurt better than other dairy products because it contains bacteria that help with digestion. WHAT FOODS ARE NOT RECOMMENDED? The following are some foods and drinks that may worsen your symptoms:  Fatty foods, such as [place] fries.  Milk products, such as cheese or ice cream.  Chocolate.  Alcohol.  Products with caffeine, such as coffee.  Carbonated drinks, such as soda. The items listed above may not be a complete list of foods and beverages to avoid. Contact your dietitian for more information. WHAT FOODS ARE GOOD SOURCES OF FIBER? Your health care provider or dietitian may recommend that you eat more foods that contain fiber. Fiber can help reduce constipation and other IBS symptoms. Add foods with fiber to your diet a little at a time so that your body can get used to them. Too much fiber at once might cause gas and swelling of your abdomen. The following are some foods that are good sources of fiber:  Apples.  Peaches.  Pears.  Berries.  Figs.  Broccoli (raw).  Cabbage.  Carrots.  Raw peas.  Kidney beans.  Lima beans.  Whole grain bread.  Whole grain cereal. FOR MORE INFORMATION  International Foundation for Functional Gastrointestinal Disorders: www.iffgd.[employer] of Diabetes and Digestive  and Kidney Diseases: NetworkAffair.co.za.aspx   This information is not intended to replace advice given to you by your health care provider. Make sure you discuss any questions you have with your health care provider.   Document Released: 09/16/2003 Document Revised: 07/17/2014 Document Reviewed: 09/26/2013 Elsevier Interactive Patient Education 2016 Elsevier Inc. Myofascial Pain Syndrome and Fibromyalgia Myofascial pain syndrome and fibromyalgia are both pain disorders. This pain may be felt mainly in your muscles.   Myofascial pain syndrome:  Always has trigger points or tender points in the muscle that will cause pain when pressed. The pain may come and go.  Usually affects your neck, upper back, and shoulder areas. The pain often radiates into your arms and hands.  Fibromyalgia:  Has muscle pains and tenderness that come and go.  Is often associated with fatigue and sleep disturbances.  Has trigger points.  Tends to be long-lasting (chronic), but is not life-threatening. Fibromyalgia and myofascial pain are not the same. However, they often occur together. If you have both conditions, each can make the other worse. Both are common and can cause enough pain and fatigue to make day-to-day  activities difficult.  CAUSES  The exact causes of fibromyalgia and myofascial pain are not known. People with certain gene types may be more likely to develop fibromyalgia. Some factors can be triggers for both conditions, such as:   Spine disorders.  Arthritis.  Severe injury (trauma) and other physical stressors.  Being under a lot of stress.  A medical illness. SIGNS AND SYMPTOMS  Fibromyalgia The main symptom of fibromyalgia is widespread pain and tenderness in your muscles. This can vary over time. Pain is sometimes described as stabbing, shooting, or burning. You may have tingling or numbness, too. You may also have  sleep problems and fatigue. You may wake up feeling tired and groggy (fibro fog). Other symptoms may include:   Bowel and bladder problems.  Headaches.  Visual problems.  Problems with odors and noises.  Depression or mood changes.  Painful menstrual periods (dysmenorrhea).  Dry skin or eyes. Myofascial pain syndrome Symptoms of myofascial pain syndrome include:   Tight, ropy bands of muscle.   Uncomfortable sensations in muscular areas, such as:  Aching.  Cramping.  Burning.  Numbness.  Tingling.   Muscle weakness.  Trouble moving certain muscles freely (range of motion). DIAGNOSIS  There are no specific tests to diagnose fibromyalgia or myofascial pain syndrome. Both can be hard to diagnose because their symptoms are common in many other conditions. Your health care provider may suspect one or both of these conditions based on your symptoms and medical history. Your health care provider will also do a physical exam.  The key to diagnosing fibromyalgia is having pain, fatigue, and other symptoms for more than three months that cannot be explained by another condition.  The key to diagnosing myofascial pain syndrome is finding trigger points in muscles that are tender and cause pain elsewhere in your body (referred pain). TREATMENT  Treating fibromyalgia and myofascial pain often requires a team of health care providers. This usually starts with your primary provider and a physical therapist. You may also find it helpful to work with alternative health care providers, such as massage therapists or acupuncturists. Treatment for fibromyalgia may include medicines. This may include nonsteroidal anti-inflammatory drugs (NSAIDs), along with other medicines.  Treatment for myofascial pain may also include:  NSAIDs.  Cooling and stretching of muscles.  Trigger point injections.  Sound wave (ultrasound) treatments to stimulate muscles. HOME CARE INSTRUCTIONS   Take  medicines only as directed by your health care provider.  Exercise as directed by your health care provider or physical therapist.  Try to avoid stressful situations.  Practice relaxation techniques to control your stress. You may want to try:  Biofeedback.  Visual imagery.  Hypnosis.  Muscle relaxation.  Yoga.  Meditation.  Talk to your health care provider about alternative treatments, such as acupuncture or massage treatment.  Maintain a healthy lifestyle. This includes eating a healthy diet and getting enough sleep.  Consider joining a support group.  Do not do activities that stress or strain your muscles. That includes repetitive motions and heavy lifting. SEEK MEDICAL CARE IF:   You have new symptoms.  Your symptoms get worse.  You have side effects from your medicines.  You have trouble sleeping.  Your condition is causing depression or anxiety. FOR MORE INFORMATION   National Fibromyalgia Association: http://www.fmaware.orgwww.fmaware.[place]: http://www.arthritis.orgwww.arthritis.org  American Chronic Pain Association: StreetWrestling.at.https://stevens.biz/   This information is not intended to replace advice given to you by your health care provider. Make sure you discuss any questions you  have with your health care provider.   Document Released: 06/26/2005 Document Revised: 07/17/2014 Document Reviewed: 04/01/2014 Elsevier Interactive Patient Education [employer].

## 2015-04-28 NOTE — Progress Notes (Signed)
Subjective:  This chart was scribed for Michelle Hammond,  by Tamsen Roers, at [facility].  This patient was seen in room 12 and the patient's care was started at 11:46 AM.    Patient ID: Michelle Hammond, female    DOB: 01-Oct-1970, 44 y.o.   MRN: [id number] Chief Complaint  Patient presents with  . bowel movement    x 1 week ago saw blood, also there was blood this am   . pt states bowel movement    4-5 times everyday, "feels like go all the time"  . Depression during triage    pt states "depression x 1 week", score 0  . ovarian pain both sides    x 2 wks  . vaginal discharge    x 2wks   HPI  HPI Comments: Michelle Hammond is a 44 y.o. female who presents to the [facility] complaining of abnormal bowel movements, abdominal pain and fatigue.     Bowel Problems: Patient states that she saw blood in her stool 1 week ago and states that she has bowel movements about 4-10 times a day. She also notes of itching and is aware that she has hemorrhoids which she does not use medication for.  She states that she has lost her appetite and doesn't want to eat any kind of food. When she does eat anything, she has diarrhea at times (last this morning).  When she does have her bowel movements, she states that she has extreme abdominal pain to the point where she has to lay in bed.  The bowel movements give her significant relief but states that she feels very hungry afterwards.  She states that she has had these symptoms for a long time, but it has gotten worse recently.  She states that she does have nausea but has not been vomiting. She took Omeprazole (2 pills a day)  for 3-4 days (about a week ago) for her reflux and states that it did not provide her any relief.  After her low iodine diet, she states that her mouth felt very dry and she still had no appetite.  She does not eat cheese, yogurt, ice cream or butter nor does she drink milk.  She states that she eats  a lot of vegetables, rice and oatmeal.  Patient does not eat any meat and eats tofu instead.  She has a sensation that foods get "stuck"  when she eats.    Stress: She feels that she does have stress often and has feelings of being scared which cause her to shake.  In the past, she felt very strong mentally but now feels worried often times.   Fatigue: Patient notes that she feels tired "all the time" with weakness.   -------- We had seen patient 6 weeks ago with suspected fibromyalgia and was started on Flexeril and advised trial elimination diet to see if she was sensitive to lactose or gluten.  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  We also had patient started on Meloxicam in the morning and also suspected patient had carpal tunnel syndrome and extensive lab work up was undertaken.  ESR was mildly  Elevated at 35.  Hemoglobin mildly low at 12.  CBC was normal. Vitamin B12 was high.  Vitamin D was normal. CRP was negative. ANA was positive but confirmatory  test was negative. CK and magnesium was normal. At her last visit she had an OMP stool but no other stool tests.   Depression screen [facility] 2/9 04/28/2015 03/16/2015 05/11/2014  Decreased Interest 0 0 0  Down, Depressed, Hopeless (No Data) 0 0  PHQ - 2 Score 0 0 0       Past Medical History  Diagnosis Date  . Hepatitis B   . Thyroid disease     SOMETIMES FAST HEART BEAT, MUSCLES TINGLE, SOMETIMES HARD TO SWALLOW,  AND FEELS LIKE NEEDS TO CLEAR THROAT  . Allergy     SEASONAL   . Cancer [facility])     Thyroid cancer   . Numbness     WHOLE RIGHT SIDE OF BODY FEELS NUMB AND PAINFUL,  AND PAIN LEFT SIDE OF BODY PAINFUL - BUT NO NUMBNESS LEFT SIDE - STATES SHE HAS HAD PROBLEM FOR YEARS- DOES NOT KNOW THE CAUSE OF HER NUMBNESS AND PAIN  . Shortness of breath   . Asthma     NO LONGER USING INHALER  . GERD (gastroesophageal reflux disease)     EVERY TIME I  EAT I HAVE BURPING, INDIGESTION AND DIARRHEA  . Hemorrhoids     PAINFUL AND SOMETIMES BLEEDING  . Bruises easily      Current Outpatient Prescriptions on File Prior to Visit  Medication Sig Dispense Refill  . calcium carbonate (OS-CAL - DOSED IN MG OF ELEMENTAL CALCIUM) 1250 MG tablet Take 2 tablets (1,000 mg of elemental calcium total) by mouth 2 (two) times daily with a meal. 60 tablet 0  . Cholecalciferol (VITAMIN D-3) 5000 UNITS TABS Take 1 tablet by mouth daily.    Marland Kitchen levothyroxine (SYNTHROID, LEVOTHROID) 100 MCG tablet Take 1 tablet (100 mcg total) by mouth daily. 45 tablet 1  . metoprolol succinate (TOPROL-XL) 25 MG 24 hr tablet Take 1 tablet (25 mg total) by mouth daily. 90 tablet 3  . Misc Natural Products (OSTEO BI-FLEX ADV TRIPLE ST) TABS Take 2 tablets by mouth daily.    . Multiple Vitamin (MULTIVITAMIN WITH MINERALS) TABS tablet Take 1 tablet by mouth daily.    . cyclobenzaprine (FLEXERIL) 10 MG tablet Take 1 tablet (10 mg total) by mouth 3 (three) times daily as needed for muscle spasms. (Patient not taking: Reported on 04/28/2015) 30 tablet 0  . meloxicam (MOBIC) 15 MG tablet Take 1 tablet (15 mg total) by mouth daily. (Patient not taking: Reported on 04/28/2015) 30 tablet 1  . OVER THE COUNTER MEDICATION PREPARATION H SUPPOSITORY  ONCE A DAY AT NIGHT     No current facility-administered medications on file prior to visit.    Allergies  Allergen Reactions  . Aspirin Other (See Comments)    Easily bruising       Review of Systems  Constitutional: Positive for appetite change and fatigue. Negative for fever and chills.  HENT: Negative for congestion.   Eyes: Negative for pain, discharge, redness and itching.  Respiratory: Negative for cough, choking and shortness of breath.   Cardiovascular: Negative for chest pain.  Gastrointestinal: Positive for nausea, abdominal pain, diarrhea, blood in stool and anal bleeding. Negative for vomiting.  Musculoskeletal: Negative for  back pain, neck pain and neck stiffness.  Neurological: Negative for syncope and speech difficulty.       Objective:   Physical Exam  Constitutional: She is oriented to person, place, and time.  HENT:  Head: Normocephalic and atraumatic.  Eyes: Conjunctivae are normal. Pupils are equal, round, and reactive to  light.  Neck: Normal range of motion.  Pulmonary/Chest: Effort normal. No respiratory distress.  Genitourinary:  She had normal labia, normal vagina, anterior cervix small amount of menstrual blood, no vaginal discharge.  Bimanual exam uterus is mobile and non tender. But apex of vagina feels very firm which is likely due to patient fibroid. Normal adnexa and rectum had large external hemorrhoid at 7 pm and internal hemorrhoid at 5 pm.  No stool involved, no gross blood or other abnormality.   Musculoskeletal: Normal range of motion.  Neurological: She is alert and oriented to person, place, and time.  Skin: Skin is warm and dry.  Nursing note and vitals reviewed.   Filed Vitals:   04/28/15 1128  BP: 128/90  Pulse: 87  Temp: 98.7 F (37.1 C)  TempSrc: Oral  Resp: 16  Height: 5' 1" (1.549 m)  Weight: 141 lb 12.8 oz (64.32 kg)  SpO2: 98%       Assessment & Plan:   1. Diarrhea, unspecified type   2. Gastroesophageal reflux disease, esophagitis presence not specified   3. Dysphagia - refer to GI  4. Pain in joint, pelvic region and thigh, unspecified laterality   5. BV (bacterial vaginosis)   6. Helicobacter positive gastritis   Complete flagyl and before starting triple therapy for H. Pylori. Recheck if sxs not improving.  Orders Placed This Encounter  Procedures  . H. pylori breath test  . Fecal lactoferrin  . Ambulatory referral to Gastroenterology    Referral Priority:  Routine    Referral Type:  Consultation    Referral Reason:  Specialty Services Required    Number of Visits Requested:  1  . POCT occult blood stool  . POCT Wet + KOH Prep ([facility])  . POCT  urinalysis dipstick  . POCT Microscopic Urinalysis ([facility])    Meds ordered this encounter  Medications  . DISCONTD: omeprazole (PRILOSEC) 10 MG capsule    Sig: Take 10 mg by mouth daily.  Marland Kitchen DISCONTD: gi cocktail (Maalox,Lidocaine,Donnatal)    Sig:   . DISCONTD: metroNIDAZOLE (FLAGYL) 500 MG tablet    Sig: Take 1 tablet (500 mg total) by mouth 2 (two) times daily with a meal. DO NOT CONSUME ALCOHOL WHILE TAKING THIS MEDICATION.    Dispense:  14 tablet    Refill:  0  . DISCONTD: omeprazole (PRILOSEC) 40 MG capsule    Sig: Take 1 capsule (40 mg total) by mouth daily.    Dispense:  30 capsule    Refill:  3  . hydrocortisone (ANUSOL-HC) 25 MG suppository    Sig: Place 1 suppository (25 mg total) rectally 2 (two) times daily.    Dispense:  12 suppository    Refill:  0  . amoxicillin (AMOXIL) 500 MG capsule    Sig: Take 2 capsules (1,000 mg total) by mouth 2 (two) times daily.    Dispense:  56 capsule    Refill:  0  . clarithromycin (BIAXIN) 500 MG tablet    Sig: Take 1 tablet (500 mg total) by mouth 2 (two) times daily.    Dispense:  28 tablet    Refill:  0  . omeprazole (PRILOSEC) 20 MG capsule    Sig: Take 1 capsule (20 mg total) by mouth 2 (two) times daily.    Dispense:  60 capsule    Refill:  1   Over 40 min spent in face-to-face evaluation of and consultation with patient and coordination of care.  Over 50% of this time was  spent counseling this patient.  I personally performed the services described in this documentation, which was scribed in my presence. The recorded information has been reviewed and considered, and addended by me as needed.  Michelle Cheadle, Hammond MPH

## 2015-04-29 LAB — H. PYLORI BREATH TEST: H. pylori Breath Test: DETECTED — AB

## 2015-04-30 LAB — FECAL LACTOFERRIN, QUANT: LACTOFERRIN: POSITIVE

## 2015-05-01 MED ORDER — AMOXICILLIN 500 MG PO CAPS: 1000.0000 mg | ORAL_CAPSULE | Freq: Two times a day (BID) | ORAL | Status: DC

## 2015-05-01 MED ORDER — OMEPRAZOLE 20 MG PO CPDR: 20.0000 mg | DELAYED_RELEASE_CAPSULE | Freq: Two times a day (BID) | ORAL | Status: AC

## 2015-05-01 MED ORDER — CLARITHROMYCIN 500 MG PO TABS: 500.0000 mg | ORAL_TABLET | Freq: Two times a day (BID) | ORAL | Status: DC

## 2015-05-02 ENCOUNTER — Telehealth: Payer: Self-pay | Admitting: Family Medicine

## 2015-05-02 NOTE — Telephone Encounter (Signed)
Patient dropped her Flagyl pills. She is in need of more. Please send to [facility] on [facility].   [phone]

## 2015-05-03 ENCOUNTER — Telehealth: Payer: Self-pay

## 2015-05-03 ENCOUNTER — Encounter: Payer: Self-pay | Admitting: Physician Assistant

## 2015-05-03 NOTE — Telephone Encounter (Signed)
Hewitt Shorts at 05/03/2015 10:13 AM     Status: Signed       Expand All Collapse All   Pt daughter is calling to see what medications need to be taken now since dr Brigitte Pulse changed them  Best number [phone]             clarithromycin (BIAXIN) 500 MG tablet [[id number]] amoxicillin (AMOXIL) 500 MG capsule [[id number] omeprazole (PRILOSEC) 20 MG capsule [[id number]

## 2015-05-03 NOTE — Telephone Encounter (Signed)
Spoke with pt's daughter and advised message from Dr. Brigitte Pulse. Pt is going out of town in an hour so I advised her to call me to let me know which pharmacy I can send her Medication into when she gets to [place].

## 2015-05-03 NOTE — Telephone Encounter (Signed)
As noted in the lab visit, she should finish her course of flagyl bid x 1 wk before starting on the below antibiotic regimen. Please call her in however many she needs to complete the course of flagyl before starting the treatment for h. pylori

## 2015-05-03 NOTE — Telephone Encounter (Signed)
Pt daughter is calling to see what medications need to be taken now since dr Brigitte Pulse changed them  Best number [phone]

## 2015-05-04 MED ORDER — METRONIDAZOLE 500 MG PO TABS: 500.0000 mg | ORAL_TABLET | Freq: Two times a day (BID) | ORAL | Status: DC

## 2015-05-04 NOTE — Telephone Encounter (Signed)
Spoke with pt, sent in Rx to [facility]. Advised to finish ABX course and to start the other ABX after she finishes. Pt understood.

## 2015-05-04 NOTE — Telephone Encounter (Signed)
Pt called to let me know the pharmacy. [phone]

## 2015-05-18 ENCOUNTER — Ambulatory Visit: Payer: BLUE CROSS/BLUE SHIELD | Admitting: Physician Assistant

## 2015-06-11 DIAGNOSIS — R109 Unspecified abdominal pain: Principal | ICD-10-CM

## 2015-06-11 DIAGNOSIS — R102 Pelvic and perineal pain: Secondary | ICD-10-CM | POA: Insufficient documentation

## 2015-06-15 ENCOUNTER — Ambulatory Visit (INDEPENDENT_AMBULATORY_CARE_PROVIDER_SITE_OTHER): Payer: BLUE CROSS/BLUE SHIELD | Admitting: Podiatry

## 2015-06-15 ENCOUNTER — Encounter: Payer: Self-pay | Admitting: Podiatry

## 2015-06-15 ENCOUNTER — Ambulatory Visit (INDEPENDENT_AMBULATORY_CARE_PROVIDER_SITE_OTHER): Payer: BLUE CROSS/BLUE SHIELD

## 2015-06-15 VITALS — BP 144/95 | HR 73 | Resp 18

## 2015-06-15 DIAGNOSIS — R52 Pain, unspecified: Secondary | ICD-10-CM

## 2015-06-15 DIAGNOSIS — M8430XA Stress fracture, unspecified site, initial encounter for fracture: Secondary | ICD-10-CM | POA: Diagnosis not present

## 2015-06-15 NOTE — Progress Notes (Signed)
   Subjective:    Patient ID: Michelle Hammond, female    DOB: [birth year], 44 y.o.   MRN: [id number]  HPI  44 year-old female presents the office of concerns of right foot there is ongoing for 2 weeks. She states that she has a sharp pain in the pinpoint spot on the top of her foot it feels like a sharp pain but the top the bottom of her foot. She says the areas painful when pressure and weightbearing as applied. Denies any recent injury or trauma. Denies any swelling or redness. No other complaints at this time. She's had no previous treatment.  Review of Systems  All other systems reviewed and are negative.      Objective:   Physical Exam General: AAO x3, NAD  Dermatological: Skin is warm, dry and supple bilateral. Nails x 10 are well manicured; remaining integument appears unremarkable at this time. There are no open sores, no preulcerative lesions, no rash or signs of infection present.  Vascular: Dorsalis Pedis artery and Posterior Tibial artery pedal pulses are 2/4 bilateral with immedate capillary fill time. Pedal hair growth present. No varicosities and no lower extremity edema present bilateral. There is no pain with calf compression, swelling, warmth, erythema.   Neruologic: Grossly intact via light touch bilateral. Vibratory intact via tuning fork bilateral. Protective threshold with Semmes Wienstein monofilament intact to all pedal sites bilateral.  Musculoskeletal: There is specific pinpoint bony tenderness along the base of the right second metatarsal and there is significant pain with vibratory sensation overlying this area. There is trace edema around the area without any significant erythema or increase in warmth. There is no other areas of pinpoint bony tenderness or pain the vibratory sensation. Muscular strength 5/5 in all groups tested bilateral.  Gait: Unassisted, Nonantalgic.         Assessment & Plan:  44 year old female with concern of right second metatarsal  stress fracture -X-rays were obtained and reviewed with the patient.  -Treatment options discussed including all alternatives, risks, and complications -Etiology of symptoms were discussed -Recommend immobilization. Dispensed surgical shoe. -Also back in 2 weeks for repeat x-rays or sooner if any problems are to arise. Call if questions concerns the meantime. *X-ray right foot next appointment  Celesta Gentile, DPM

## 2015-06-30 ENCOUNTER — Telehealth: Payer: Self-pay | Admitting: Internal Medicine

## 2015-06-30 MED ORDER — LEVOTHYROXINE SODIUM 100 MCG PO TABS: 100.0000 ug | ORAL_TABLET | Freq: Every day | ORAL | Status: DC

## 2015-06-30 NOTE — Telephone Encounter (Signed)
Patient called stating that she would like a refill on her Rx  Rx: Levothyroxine   Pharmacy: [facility]     Thank you

## 2015-07-06 ENCOUNTER — Ambulatory Visit: Payer: BLUE CROSS/BLUE SHIELD | Admitting: Podiatry

## 2015-07-19 ENCOUNTER — Ambulatory Visit: Payer: BLUE CROSS/BLUE SHIELD | Admitting: Internal Medicine

## 2015-07-20 ENCOUNTER — Encounter: Payer: Self-pay | Admitting: Internal Medicine

## 2015-07-20 ENCOUNTER — Other Ambulatory Visit: Payer: Self-pay | Admitting: *Deleted

## 2015-07-20 ENCOUNTER — Ambulatory Visit (INDEPENDENT_AMBULATORY_CARE_PROVIDER_SITE_OTHER): Payer: BLUE CROSS/BLUE SHIELD | Admitting: Internal Medicine

## 2015-07-20 VITALS — BP 114/62 | HR 79 | Temp 98.0°F | Resp 12 | Wt 145.8 lb

## 2015-07-20 DIAGNOSIS — C73 Malignant neoplasm of thyroid gland: Secondary | ICD-10-CM | POA: Diagnosis not present

## 2015-07-20 DIAGNOSIS — E890 Postprocedural hypothyroidism: Secondary | ICD-10-CM

## 2015-07-20 LAB — T4, FREE: FREE T4: 1 ng/dL (ref 0.60–1.60)

## 2015-07-20 LAB — TSH: TSH: 0.6 u[IU]/mL (ref 0.35–4.50)

## 2015-07-20 NOTE — Progress Notes (Signed)
Patient ID: Michelle Hammond, female   DOB: 06/13/1971, 45 y.o.   MRN: [id number]   HPI  Michelle Hammond is a 45 y.o.-year-old female, returning for f/u for papillary thyroid cancer and postop hypothyroidism. Last visit 6 mo ago. New PCP: Dr Jonathon Jordan.  She is treated for HTN. She was started on Benicar-HCT 20/12.5 mg.  Reviewed hx: Pt. has been found to have 2 L thyroid nodules (one of 7 x 10 mm and one of 13 x 14 mm, no calcifications) on thyroid U/S 11/14/2013 in [place] >> had a thyroid nodule FNA on 11/30/2013 (unclear of which of the 2 nodules) >> papillary thyroid carcinoma. She brought records in [place]. The interpreter translated the reports for [place]. The word carcinoma was easily distinguishable on the report.  02/05/2014: total thyroidectomy by Dr Harlow Asa  Diagnosis 1. Thyroid, thyroidectomy - PAPILLARY THYROID CARCINOMA, THREE FOCI, 1.4, 0.3, AND 0.1 CM. - FOCAL VASCULAR INVOLVEMENT BY TUMOR. - FOCAL EXTRA THYROID EXTENSION. - MARGINS NOT INVOLVED. - SEE ONCOLOGY TABLE. 2. Lymph node, biopsy, central compartment - ONE BENIGN LYMPH NODE (0/1). Microscopic Comment 1. THYROID Specimen: Thyroid and central compartment lymph node. Procedure: Thyroidectomy and lymph node biopsy. Specimen Integrity (intact/fragmented): Focally disrupted. Tumor focality: Three foci of tumor, left lobe 1.4 cm, right lobe, 0.3 cm and 0.1 cm..  Dominant tumor: Left superior thyroid. Maximum tumor size (cm): 1.4 cm. Tumor laterality: Left. Histologic type (including subtype and/or unique features as applicable): Papillary carcinoma. Tumor capsule: N/A Extrathyroidal extension: Present. Margins: Free of tumor, invasive tumor focally less than 0.1 cm from inked margin. Lymph - Vascular invasion: Present, focal.  Second and additional tumors: Right lobe Tumor size(s): 0.3 and 0.1 cm Tumor laterality: Right. Histologic type (including subtype and/or unique features as applicable) : Papillary  thyroid carcinoma. Tumor capsule: N/A Extrathyroidal extension: No. Margins: Free of tumor. Lymph - Vascular invasion: No. Lymph nodes: # examined 1; # positive; 0 TNM code: pT3 , pN0 04/13/2014: RAI Tx 103 mCi I-131 04/21/2014: Post tx WBS: focal activity in thyroid bed only. 05/18/2014: Tg 0.2, ATA <1 01/18/2015: Tg 0.1, ATA <1 04/23/2015: 1 year WBS with Thyrogen: no cancer recurrence or metastasis  She was started on Synthroid 88 mcg daily after the surgery - this had to be stopped for ~3 weeks to allow the TSH to increase for the remnant ablation. She is now on 100 mcg alternating with 88 mcg daily.  She takes the LT4: - fasting - w/ water  - b'fast 30 min later - + occasional PPI and MVI - late in the day - + Ca at night  I reviewed pt's thyroid tests: Lab Results  Component Value Date   TSH 0.218* 03/16/2015   TSH 2.10 01/18/2015   TSH 1.15 09/01/2014   TSH 3.30 06/18/2014   TSH 7.36* 05/18/2014   TSH 50.82* 04/10/2014   TSH 12.45* 04/02/2014   TSH 4.24 03/20/2014   TSH 0.57 03/02/2014   TSH 0.47 12/16/2013   FREET4 1.22 03/16/2015   FREET4 0.81 01/18/2015   FREET4 0.91 09/01/2014   FREET4 0.96 06/18/2014   FREET4 0.99 05/18/2014   FREET4 0.90 03/02/2014   FREET4 0.71 12/16/2013   FREET4 1.04 04/08/2010  2012: TSH 0.628  Pt describes: - + fatigue, needs to sleep a lot - + weight gain - + hot flushes - no palpitations - no tremors - no constipation - + hair loss  I reviewed pt's medications, allergies, PMH, social hx, family hx, and changes  were documented in the history of present illness. Otherwise, unchanged from my initial visit note.  ROS: Constitutional: + see HPI Eyes: + blurry vision, no xerophthalmia ENT: no sore throat, no nodules palpated in throat, no dysphagia/odynophagia, no hoarseness Cardiovascular: no CP/+ occas. SOB/no palpitations/leg swelling Respiratory: no cough/+ occas. SOB Gastrointestinal: no N/V/D/C/+ acid  reflux Musculoskeletal: + both muscle/+ joint aches Skin: no rash, + hair loss Neurological: no tremors/numbness/tingling/dizziness, + HA  PE: BP 114/62 mmHg  Pulse 79  Temp(Src) 98 F (36.7 C) (Oral)  Resp 12  Wt 145 lb 12.8 oz (66.134 kg)  SpO2 98% Body mass index is 27.56 kg/(m^2).  Wt Readings from Last 3 Encounters:  07/20/15 145 lb 12.8 oz (66.134 kg)  04/28/15 141 lb 12.8 oz (64.32 kg)  03/16/15 148 lb (67.132 kg)   Constitutional: normal weight, in NAD Eyes: PERRLA, EOMI, no exophthalmos ENT: moist mucous membranes, no neck masses - thyroidectomy scan healed - no discomfort at the site; no cervical lymphadenopathy Cardiovascular: RRR, No MRG Respiratory: CTA B Gastrointestinal: abdomen soft, NT, ND, BS+ Musculoskeletal: no deformities, strength intact in all 4 Skin: moist, warm, no rashes Neurological: no tremor with outstretched hands, DTR normal in all 4  ASSESSMENT: 1. Papillary thyroid cancer  2. Postsurgical hypothyroidism  PLAN:  1. PTC - pt with non-recurrent, non-metastatic PTC - reviewed together the 1 year WBS report: no mets, no recurrent ThyCa foci - reviewed her most recent Tg, which was very low, and decreasing, which is great - I explained that this is a "cancer marker"; if stays low, this is very reassuring. - will check thyroid tests today: TSH, free T4, also Tg + ATA  2. Postsurgical Hypothyroidism - on LT4 100 alternating with 88 mcg (I initially advised her to stay only on 100 mcg daily but she went back to alternating the 2 doses as she had hot flushes and dizziness - pt still c/o weight gain and fatigue - reviewed latest TFTs: TSH was slightly lower than the LLN, but this is desirable in the first year after RAI tx, explained that from now on, we can aim for a TSH in the low normal range - again advised to take the thyroid hormone every day, with water, >30 minutes before breakfast, separated by >4 hours from acid reflux medications, calcium,  iron, multivitamins. She is taking it correctly. - Return in about 6 months (around 01/17/2016).   Needs refill of LT4 88.   Office Visit on 07/20/2015  Component Date Value Ref Range Status  . TSH 07/20/2015 0.60  0.35 - 4.50 uIU/mL Final  . Thyroglobulin 07/20/2015 0.1* 2.8 - 40.9 ng/mL Final   Comment: Thyroglobulin antibodies (TGAb) interfere with Thyroglobulin (TG) assays; therefore, Thyroglobulin antibody (TGAb) assay should always be performed in conjunction with a Thyroglobulin (TG) assay.   This test was performed using the Beckman Coulter chemiluminescent method.  Values obtained from different assay methods cannot be used interchangeably.  Thyroglobulin levels, regardless of value, should not be interpreted as absolute evidence of the presence or absence of disease.   . Thyroglobulin Ab 07/20/2015 <1  <2 IU/mL Final  . Free T4 07/20/2015 1.00  0.60 - 1.60 ng/dL Final  Labs are great!Will refill her LT4 and continue to alternate 100 with 88 mcg LT4 every other day.

## 2015-07-20 NOTE — Patient Instructions (Addendum)
Please stop at the lab.  Please come back for a follow-up appointment in 6 months  Dietary sources of calcium:  Calcium content (mg) - http://[email or web address].MoviePins.co.za  Fortified oatmeal, 1 packet 350  Sardines, canned in oil, with edible bones, 3 oz. 324  Cheddar cheese, 1 oz. shredded 306  Milk, nonfat, 1 cup 302  Milkshake, 1 cup 300  Yogurt, plain, low-fat, 1 cup 300  Soybeans, cooked, 1 cup 261  Tofu, firm, with calcium,  cup 204  Orange juice, fortified with calcium, 6 oz. 200-260 (varies)  Salmon, canned, with edible bones, 3 oz. 181  Pudding, instant, made with 2% milk,  cup 153  Baked beans, 1 cup [place], 1% milk fat, 1 cup 138  Spaghetti, lasagna, 1 cup 125  Frozen yogurt, vanilla, soft-serve,  cup 103  Ready-to-eat cereal, fortified with calcium, 1 cup 100-1,000 (varies)  Cheese pizza, 1 slice [id number]  Fortified waffles, 2 100  Turnip greens, boiled,  cup 99  Broccoli, raw, 1 cup 90  Ice cream, vanilla,  cup 85  Soy or rice milk, fortified with calcium, 1 cup 80-500 (varies)

## 2015-07-21 LAB — THYROGLOBULIN ANTIBODY: Thyroglobulin Ab: <1 IU/mL (ref <2)

## 2015-07-21 LAB — THYROGLOBULIN LEVEL: Thyroglobulin: 0.1 ng/mL — ABNORMAL LOW (ref 2.8–40.9)

## 2015-07-21 MED ORDER — LEVOTHYROXINE SODIUM 88 MCG PO TABS: 88.0000 ug | ORAL_TABLET | ORAL | Status: AC

## 2015-07-21 MED ORDER — LEVOTHYROXINE SODIUM 100 MCG PO TABS: 100.0000 ug | ORAL_TABLET | ORAL | Status: DC

## 2015-07-29 ENCOUNTER — Other Ambulatory Visit: Payer: Self-pay | Admitting: Gastroenterology

## 2015-07-29 DIAGNOSIS — B181 Chronic viral hepatitis B without delta-agent: Secondary | ICD-10-CM

## 2015-08-09 ENCOUNTER — Ambulatory Visit
Admission: RE | Admit: 2015-08-09 | Discharge: 2015-08-09 | Disposition: A | Discharge status: Home / Self Care | Payer: BLUE CROSS/BLUE SHIELD | Source: Ambulatory Visit | Attending: Gastroenterology | Admitting: Gastroenterology

## 2015-08-09 ENCOUNTER — Telehealth: Payer: Self-pay | Admitting: Internal Medicine

## 2015-08-09 DIAGNOSIS — B181 Chronic viral hepatitis B without delta-agent: Secondary | ICD-10-CM

## 2015-08-09 MED ORDER — LEVOTHYROXINE SODIUM 100 MCG PO TABS: 100.0000 ug | ORAL_TABLET | ORAL | Status: AC

## 2015-08-09 NOTE — Telephone Encounter (Signed)
Patient called stating that she would like a refill on her medication   Rx: Levothyroxine   Pharmacy: [facility]     Thank you

## 2015-08-09 NOTE — Telephone Encounter (Signed)
Pt is alternating with 100 mcg and 88 mcg of levothyroxine. Dr Cruzita Lederer sent the 88 mcg on 07/20/15. Just sent the 100 mcg to pt's pharmacy.

## 2015-10-22 ENCOUNTER — Encounter: Payer: Self-pay | Admitting: Family Medicine

## 2015-11-09 ENCOUNTER — Other Ambulatory Visit: Payer: Self-pay | Admitting: Family Medicine

## 2015-11-22 ENCOUNTER — Encounter: Payer: Self-pay | Admitting: *Deleted

## 2016-01-17 ENCOUNTER — Ambulatory Visit: Payer: BLUE CROSS/BLUE SHIELD | Admitting: Internal Medicine

## 2016-03-09 ENCOUNTER — Other Ambulatory Visit: Payer: Self-pay | Admitting: Gastroenterology

## 2016-03-09 DIAGNOSIS — B181 Chronic viral hepatitis B without delta-agent: Secondary | ICD-10-CM

## 2016-03-20 ENCOUNTER — Ambulatory Visit
Admission: RE | Admit: 2016-03-20 | Discharge: 2016-03-20 | Disposition: A | Discharge status: Home / Self Care | Payer: BLUE CROSS/BLUE SHIELD | Source: Ambulatory Visit | Attending: Gastroenterology | Admitting: Gastroenterology

## 2016-03-20 DIAGNOSIS — B181 Chronic viral hepatitis B without delta-agent: Secondary | ICD-10-CM

## 2016-05-13 IMAGING — MR MR CERVICAL SPINE W/O CM
4 of 5 series · 22 of 48 positions shown · non-contrast
Comparison: Radiography 02/16/2014

CLINICAL DATA: Neck pain and right arm pain. Personal history of
thyroid cancer with thyroidectomy.

EXAM:
MRI CERVICAL SPINE WITHOUT CONTRAST
TECHNIQUE: Multiplanar, multisequence MR imaging of the cervical spine was
performed. No intravenous contrast was administered.

[Series 2: T2 · sagittal · 3.0mm · 0.41mm/px · 6 of 12 slices shown (1 of 3)]
[im 1/12]
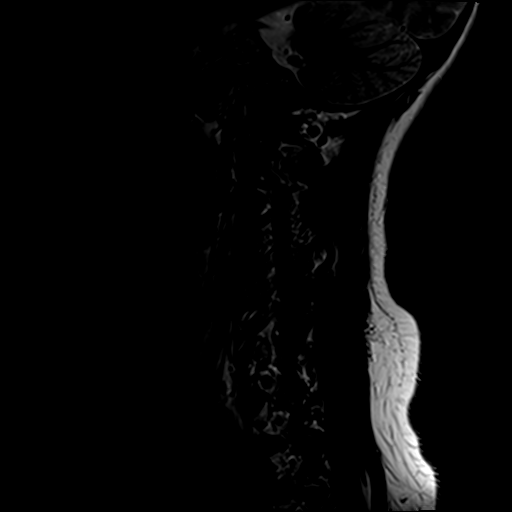
[im 3/12]
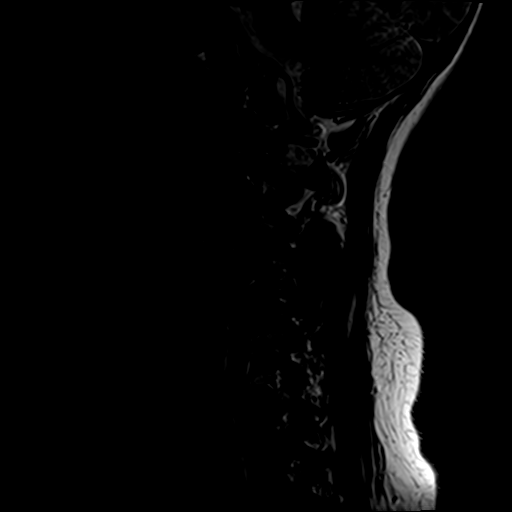
[im 5/12]
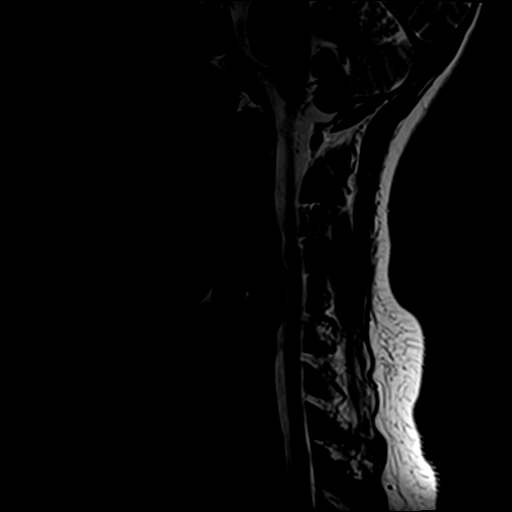
[im 7/12]
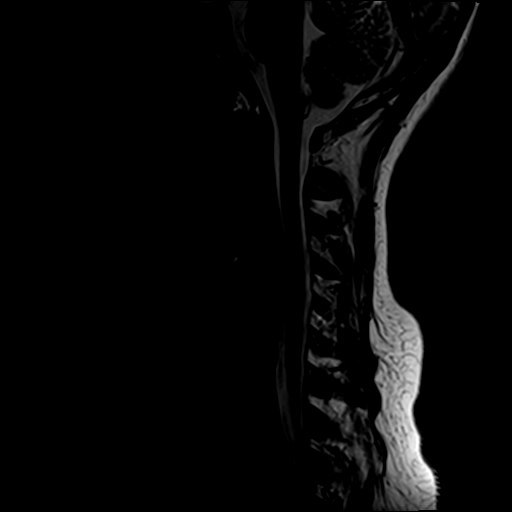
[im 9/12]
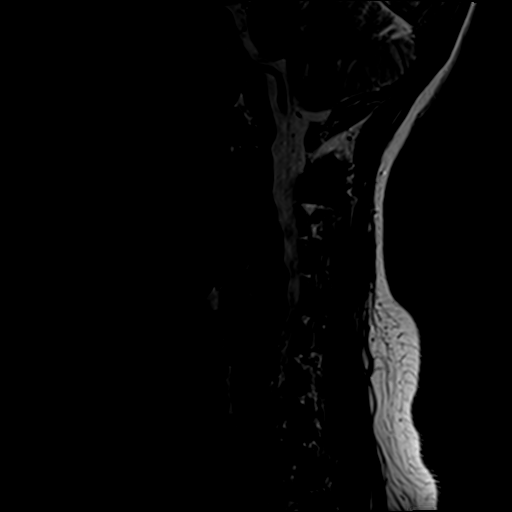
[im 12/12]
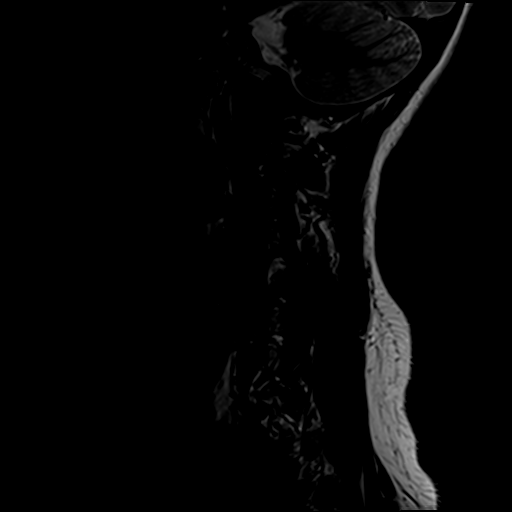

[Series 3: T1 · sagittal · 3.0mm · 0.41mm/px · 3 of 12 slices shown]
[im 2/12]
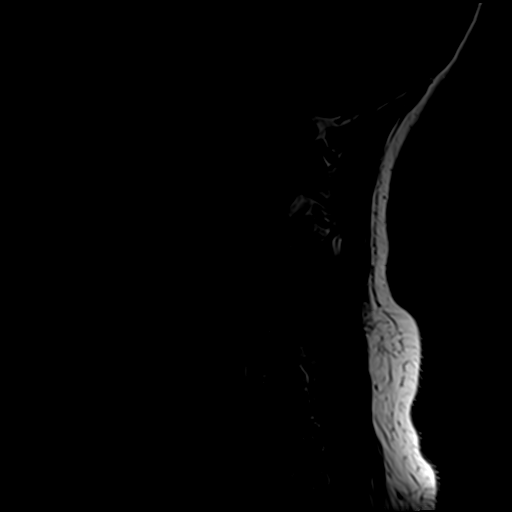
[im 6/12]
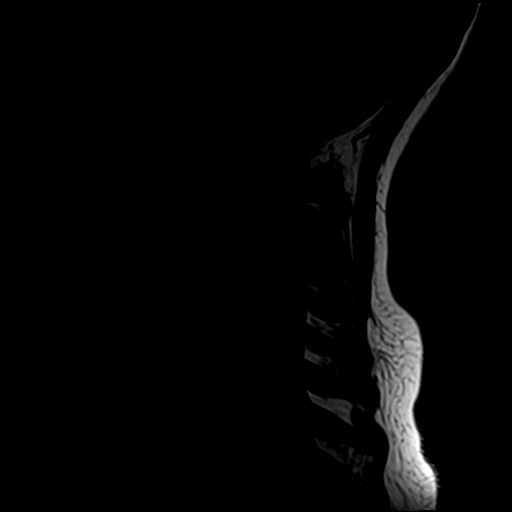
[im 10/12]
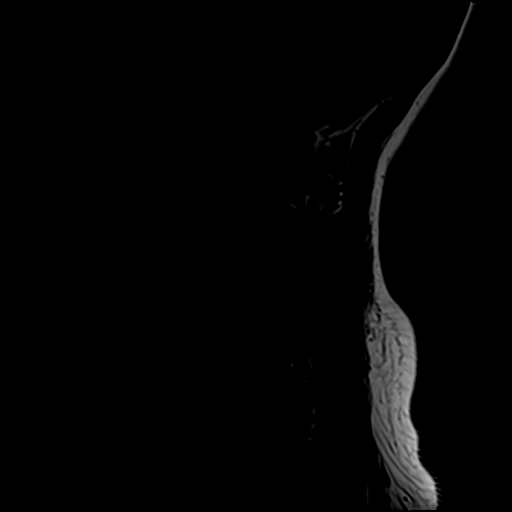

[Series 5: T2 · axial · 3.0mm · 0.39mm/px · z∈[-67,+26]mm · 8 of 26 slices shown (2 of 3)]
[im 1/26]
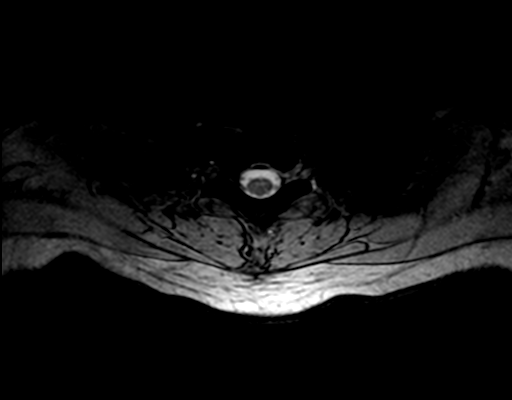
[im 4/26]
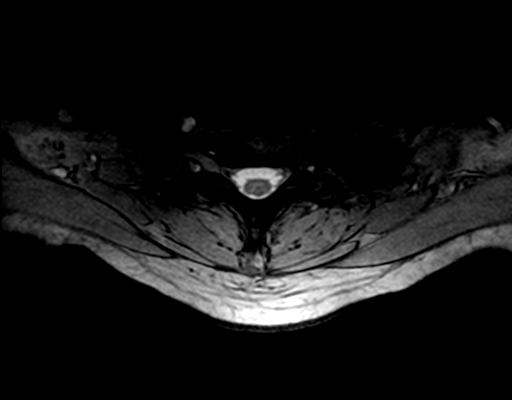
[im 8/26]
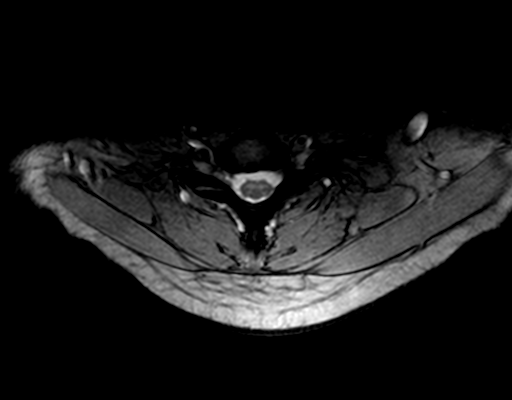
[im 12/26]
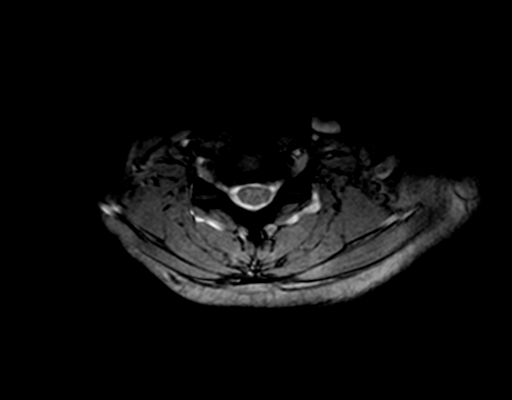
[im 14/26]
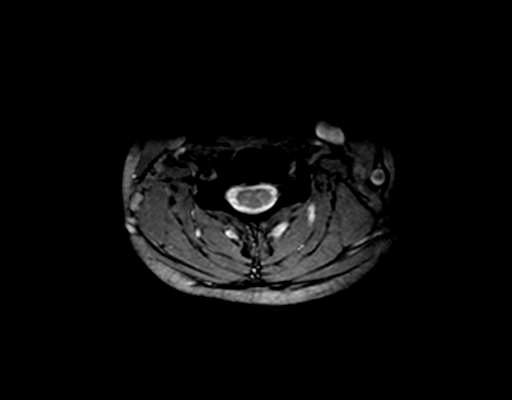
[im 18/26]
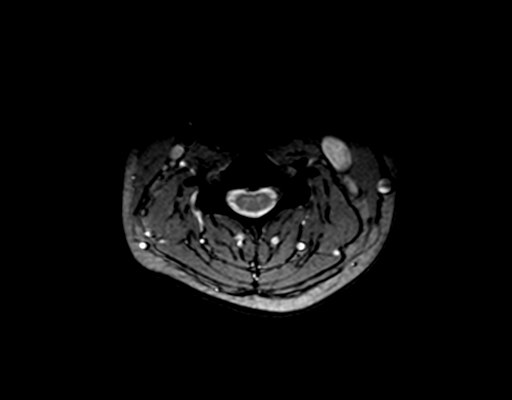
[im 22/26]
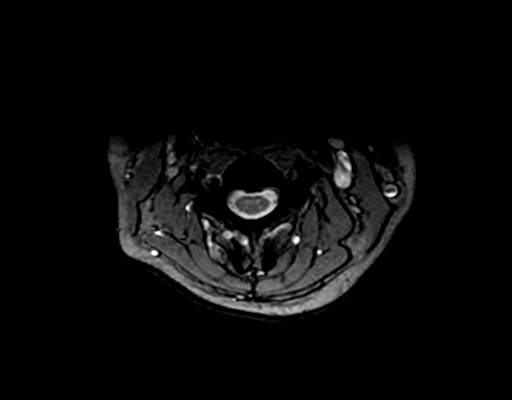
[im 26/26]
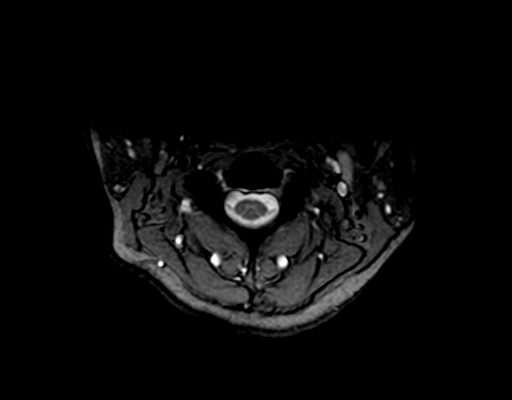

[Series 6: T2 · axial · 3.0mm · 0.39mm/px · z∈[-68,+10]mm · 5 of 26 slices shown (3 of 3)]
[im 1/26]
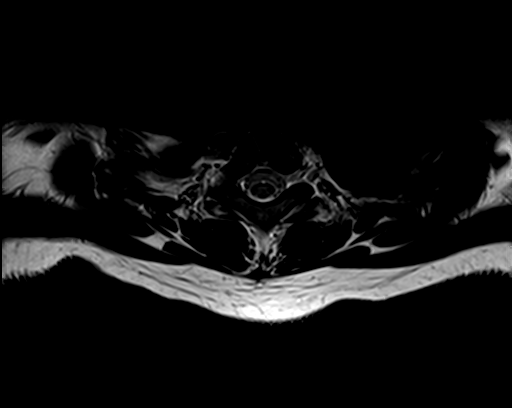
[im 4/26]
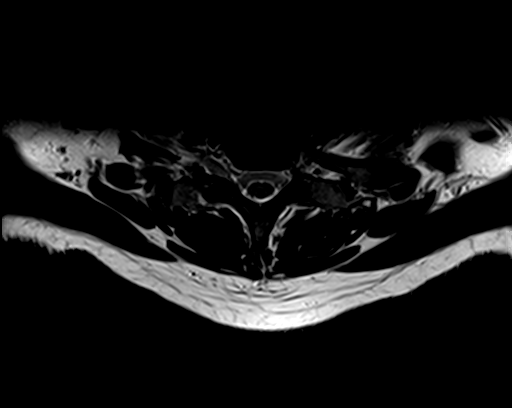
[im 8/26]
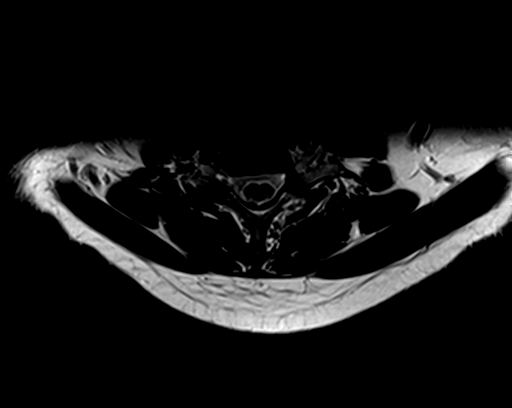
[im 14/26]
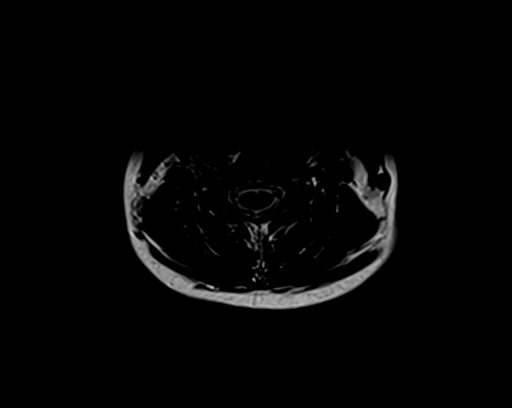
[im 22/26]
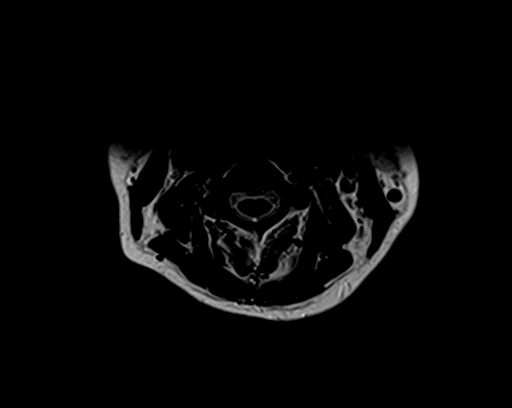

[22 of 48 positions shown; findings below may reference images not displayed]

FINDINGS: No evidence of osseous metastatic disease. Foramen magnum is widely
patent. C1-2 and C2-3 are normal.

C3-4:  Mild bulging of the disc.  No herniation or stenosis.

C4-5:  Mild bulging of the disc.  No herniation or stenosis.

C5-6: Endplate osteophytes and moderate bulging of the disc.
Narrowing of the ventral subarachnoid space but no compressive
effect upon the cord. Mild foraminal narrowing bilaterally.

C6-7:  Minimal bulging of the disc.  No stenosis.

C7-T1:  Normal interspace.
IMPRESSION: Degenerative spondylosis most pronounced at C4-5 and C5-6. At C5-6,
there is mild foraminal narrowing bilaterally, but no definite
neural compression. The findings could be associated with neck pain
or neural irritation.

## 2016-08-01 ENCOUNTER — Ambulatory Visit
Admission: RE | Admit: 2016-08-01 | Discharge: 2016-08-01 | Disposition: A | Discharge status: Home / Self Care | Payer: BLUE CROSS/BLUE SHIELD | Source: Ambulatory Visit | Attending: Family Medicine | Admitting: Family Medicine

## 2016-08-01 ENCOUNTER — Other Ambulatory Visit: Payer: Self-pay | Admitting: Family Medicine

## 2016-08-01 DIAGNOSIS — M79641 Pain in right hand: Secondary | ICD-10-CM

## 2016-08-01 DIAGNOSIS — M533 Sacrococcygeal disorders, not elsewhere classified: Secondary | ICD-10-CM

## 2016-09-25 ENCOUNTER — Other Ambulatory Visit: Payer: Self-pay | Admitting: Gastroenterology

## 2016-09-25 DIAGNOSIS — B181 Chronic viral hepatitis B without delta-agent: Secondary | ICD-10-CM

## 2016-10-02 ENCOUNTER — Ambulatory Visit
Admission: RE | Admit: 2016-10-02 | Discharge: 2016-10-02 | Disposition: A | Discharge status: Home / Self Care | Payer: BLUE CROSS/BLUE SHIELD | Source: Ambulatory Visit | Attending: Gastroenterology | Admitting: Gastroenterology

## 2016-10-02 DIAGNOSIS — B181 Chronic viral hepatitis B without delta-agent: Secondary | ICD-10-CM

## 2016-10-23 ENCOUNTER — Other Ambulatory Visit (HOSPITAL_COMMUNITY)
Admission: RE | Admit: 2016-10-23 | Discharge: 2016-10-23 | Disposition: A | Discharge status: Home / Self Care | Payer: BLUE CROSS/BLUE SHIELD | Source: Ambulatory Visit | Attending: Obstetrics and Gynecology | Admitting: Obstetrics and Gynecology

## 2016-10-23 ENCOUNTER — Other Ambulatory Visit: Payer: Self-pay | Admitting: Obstetrics and Gynecology

## 2016-10-23 DIAGNOSIS — Z1151 Encounter for screening for human papillomavirus (HPV): Secondary | ICD-10-CM | POA: Insufficient documentation

## 2016-10-23 DIAGNOSIS — Z01419 Encounter for gynecological examination (general) (routine) without abnormal findings: Secondary | ICD-10-CM | POA: Diagnosis present

## 2016-10-23 DIAGNOSIS — N6001 Solitary cyst of right breast: Secondary | ICD-10-CM

## 2016-10-25 ENCOUNTER — Ambulatory Visit
Admission: RE | Admit: 2016-10-25 | Discharge: 2016-10-25 | Disposition: A | Discharge status: Home / Self Care | Payer: BLUE CROSS/BLUE SHIELD | Source: Ambulatory Visit | Attending: Obstetrics and Gynecology | Admitting: Obstetrics and Gynecology

## 2016-10-25 DIAGNOSIS — N6001 Solitary cyst of right breast: Secondary | ICD-10-CM

## 2016-11-06 LAB — CYTOLOGY - PAP
CHLAMYDIA, DNA PROBE: NEGATIVE
DIAGNOSIS: NEGATIVE
HPV: NOT DETECTED
NEISSERIA GONORRHEA: NEGATIVE

## 2016-11-20 ENCOUNTER — Other Ambulatory Visit: Payer: Self-pay | Admitting: Internal Medicine

## 2016-11-20 DIAGNOSIS — C73 Malignant neoplasm of thyroid gland: Secondary | ICD-10-CM

## 2016-11-22 ENCOUNTER — Ambulatory Visit
Admission: RE | Admit: 2016-11-22 | Discharge: 2016-11-22 | Disposition: A | Discharge status: Home / Self Care | Payer: BLUE CROSS/BLUE SHIELD | Source: Ambulatory Visit | Attending: Internal Medicine | Admitting: Internal Medicine

## 2016-11-22 DIAGNOSIS — C73 Malignant neoplasm of thyroid gland: Secondary | ICD-10-CM

## 2017-05-28 ENCOUNTER — Encounter (HOSPITAL_COMMUNITY): Payer: Self-pay

## 2017-05-28 ENCOUNTER — Other Ambulatory Visit: Payer: Self-pay | Admitting: Internal Medicine

## 2017-05-28 DIAGNOSIS — C73 Malignant neoplasm of thyroid gland: Secondary | ICD-10-CM

## 2017-07-17 ENCOUNTER — Ambulatory Visit
Admission: RE | Admit: 2017-07-17 | Discharge: 2017-07-17 | Disposition: A | Discharge status: Home / Self Care | Payer: BLUE CROSS/BLUE SHIELD | Source: Ambulatory Visit | Attending: Family Medicine | Admitting: Family Medicine

## 2017-07-17 ENCOUNTER — Other Ambulatory Visit: Payer: Self-pay | Admitting: Family Medicine

## 2017-07-17 DIAGNOSIS — R079 Chest pain, unspecified: Secondary | ICD-10-CM

## 2017-09-25 ENCOUNTER — Other Ambulatory Visit: Payer: Self-pay | Admitting: Gastroenterology

## 2017-09-25 DIAGNOSIS — B181 Chronic viral hepatitis B without delta-agent: Secondary | ICD-10-CM

## 2017-11-19 ENCOUNTER — Other Ambulatory Visit: Payer: BLUE CROSS/BLUE SHIELD

## 2017-11-30 ENCOUNTER — Other Ambulatory Visit: Payer: Self-pay | Admitting: Gastroenterology

## 2017-12-12 ENCOUNTER — Ambulatory Visit
Admission: RE | Admit: 2017-12-12 | Discharge: 2017-12-12 | Disposition: A | Discharge status: Home / Self Care | Payer: BLUE CROSS/BLUE SHIELD | Source: Ambulatory Visit | Attending: Gastroenterology | Admitting: Gastroenterology

## 2017-12-12 DIAGNOSIS — B181 Chronic viral hepatitis B without delta-agent: Secondary | ICD-10-CM

## 2017-12-13 ENCOUNTER — Other Ambulatory Visit: Payer: Self-pay | Admitting: Internal Medicine

## 2017-12-13 DIAGNOSIS — C73 Malignant neoplasm of thyroid gland: Secondary | ICD-10-CM

## 2017-12-24 ENCOUNTER — Ambulatory Visit
Admission: RE | Admit: 2017-12-24 | Discharge: 2017-12-24 | Disposition: A | Discharge status: Home / Self Care | Payer: BLUE CROSS/BLUE SHIELD | Source: Ambulatory Visit | Attending: Internal Medicine | Admitting: Internal Medicine

## 2017-12-24 DIAGNOSIS — C73 Malignant neoplasm of thyroid gland: Secondary | ICD-10-CM

## 2018-05-22 ENCOUNTER — Other Ambulatory Visit: Payer: Self-pay | Admitting: Family Medicine

## 2018-05-22 DIAGNOSIS — N644 Mastodynia: Secondary | ICD-10-CM

## 2018-05-27 ENCOUNTER — Other Ambulatory Visit: Payer: Self-pay | Admitting: Family Medicine

## 2018-05-27 ENCOUNTER — Ambulatory Visit
Admission: RE | Admit: 2018-05-27 | Discharge: 2018-05-27 | Disposition: A | Discharge status: Home / Self Care | Payer: BLUE CROSS/BLUE SHIELD | Source: Ambulatory Visit | Attending: Family Medicine | Admitting: Family Medicine

## 2018-05-27 DIAGNOSIS — N644 Mastodynia: Secondary | ICD-10-CM

## 2018-05-27 DIAGNOSIS — N6002 Solitary cyst of left breast: Secondary | ICD-10-CM

## 2018-11-26 ENCOUNTER — Other Ambulatory Visit: Payer: BLUE CROSS/BLUE SHIELD

## 2019-08-15 ENCOUNTER — Encounter (INDEPENDENT_AMBULATORY_CARE_PROVIDER_SITE_OTHER): Payer: Self-pay | Admitting: Otolaryngology

## 2019-08-15 ENCOUNTER — Ambulatory Visit (INDEPENDENT_AMBULATORY_CARE_PROVIDER_SITE_OTHER): Payer: 59 | Admitting: Otolaryngology

## 2019-08-15 ENCOUNTER — Other Ambulatory Visit: Payer: Self-pay

## 2019-08-15 VITALS — Temp 98.1°F

## 2019-08-15 DIAGNOSIS — H9313 Tinnitus, bilateral: Secondary | ICD-10-CM

## 2019-08-15 DIAGNOSIS — J310 Chronic rhinitis: Secondary | ICD-10-CM | POA: Diagnosis not present

## 2019-08-15 NOTE — Progress Notes (Signed)
HPI: Michelle Hammond is a 49 y.o. female who presents for evaluation of left ear tinnitus.  She is also has some headaches on the right side of her head and the back of the neck.  She also complains of left-sided nasal congestion which is worse in the mornings. 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  She sometimes complains of hearing a swishing sound in the left ear but not regularly.  Past Medical History:  Diagnosis Date  . Allergy    SEASONAL   . Asthma    NO LONGER USING INHALER  . Bruises easily   . Cancer [facility])    Thyroid cancer   . GERD (gastroesophageal reflux disease)    EVERY TIME I EAT I HAVE BURPING, INDIGESTION AND DIARRHEA  . Hemorrhoids    GI Dr. Penelope Coop  . Hepatitis B    chronic, referred to [facility] liver care  . Numbness    WHOLE RIGHT SIDE OF BODY FEELS NUMB AND PAINFUL,  AND PAIN LEFT SIDE OF BODY PAINFUL - BUT NO NUMBNESS LEFT SIDE - STATES SHE HAS HAD PROBLEM FOR YEARS- DOES NOT KNOW THE CAUSE OF HER NUMBNESS AND PAIN  . Shortness of breath   . Thyroid disease    SOMETIMES FAST HEART BEAT, MUSCLES TINGLE, SOMETIMES HARD TO SWALLOW,  AND FEELS LIKE NEEDS TO CLEAR THROAT   Past Surgical History:  Procedure Laterality Date  . LYMPH NODE DISSECTION N/A 02/05/2014   Procedure: CENTRAL LYMPH NODE DISSECTION;  Surgeon: Earnstine Regal, MD;  Location: [facility];  Service: General;  Laterality: N/A;  . NO PREVIOUS SURGERY    . THYROIDECTOMY N/A 02/05/2014   Procedure: TOTAL THYROIDECTOMY ;  Surgeon: Earnstine Regal, MD;  Location: [facility];  Service: General;  Laterality: N/A;   Social History   Socioeconomic History  . Marital status: Divorced    Spouse name: Not on file  . Number of children: 1  . Years of education: 64  . Highest education level: Not on file  Occupational History  . Not on file  Tobacco Use  . Smoking status:  Never Smoker  . Smokeless tobacco: Never Used  Substance and Sexual Activity  . Alcohol use: No    Alcohol/week: 0.0 standard drinks  . Drug use: No  . Sexual activity: Not Currently    Birth control/protection: None  Other Topics Concern  . Not on file  Social History Narrative   Patient lives at home alone. Single.   Patient is not working.   Education high school.   Caffeine none    Right handed.   Social Determinants of Health   Financial Resource Strain:   . Difficulty of Paying Living Expenses: Not on file  Food Insecurity:   . Worried About Charity fundraiser in the Last Year: Not on file  . Ran Out of Food in the Last Year: Not on file  Transportation Needs:   . Lack of Transportation (Medical): Not on file  . Lack of Transportation (Non-Medical): Not on file  Physical Activity:   . Days of Exercise per Week: Not on file  . Minutes of Exercise per Session: Not on file  Stress:   . Feeling of Stress : Not on file  Social Connections:   . Frequency of Communication with Friends and Family: Not on file  .  Frequency of Social Gatherings with Friends and Family: Not on file  . Attends Religious Services: Not on file  . Active Member of Clubs or Organizations: Not on file  . Attends Archivist Meetings: Not on file  . Marital Status: Not on file   Family History  Problem Relation Age of Onset  . Hypertension Mother   . Kidney disease Father    Allergies  Allergen Reactions  . Aspirin Other (See Comments)    Easily bruising   Prior to Admission medications   Medication Sig Start Date End Date Taking? Authorizing Provider  Calcium-Magnesium-Vitamin D (CALCIUM 500 PO) Take 2 tablets by mouth daily.   Yes [provider]  Cholecalciferol (VITAMIN D-3) 5000 UNITS TABS Take 1 tablet by mouth daily. Reported on 07/20/2015   Yes [provider]  ferrous sulfate 325 (65 FE) MG tablet Take 325 mg by mouth daily with breakfast.   Yes [provider]  Glucosamine-Chondroitin-MSM (TRIPLE FLEX PO) Take by mouth. 2 capsules daily   Yes [provider]  hydrocortisone (ANUSOL-HC) 25 MG suppository Place 1 suppository (25 mg total) rectally 2 (two) times daily. 04/28/15  Yes Shawnee Knapp, MD  levothyroxine (SYNTHROID, LEVOTHROID) 100 MCG tablet Take 1 tablet (100 mcg total) by mouth every other day. Alternating with 88 mcg LT4 every other day. 08/09/15  Yes Philemon Kingdom, MD  levothyroxine (SYNTHROID, LEVOTHROID) 88 MCG tablet Take 1 tablet (88 mcg total) by mouth every other day. 07/21/15  Yes Philemon Kingdom, MD  olmesartan-hydrochlorothiazide (BENICAR HCT) 20-12.5 MG tablet Take 1 tablet by mouth daily.   Yes [provider]  omeprazole (PRILOSEC) 20 MG capsule Take 1 capsule (20 mg total) by mouth 2 (two) times daily. 05/01/15  Yes Shawnee Knapp, MD  OVER THE COUNTER MEDICATION Reported on 07/20/2015   Yes [provider]     Positive ROS: Otherwise negative  All other systems have been reviewed and were otherwise negative with the exception of those mentioned in the HPI and as above.  Physical Exam: Constitutional: Alert, well-appearing, no acute distress Ears: External ears without lesions or tenderness. Ear canals are clear bilaterally with intact, clear TMs bilaterally with normal mobility on pneumatic otoscopy.  On tuning fork testing AC > BC bilaterally..  Auscultation of the ears revealed no objective sounds or pulsatile tinnitus. Nasal: External nose without lesions. Septum midline.. Clear nasal passages bilaterally.  Both middle meatus regions are clear.  No polyps.  Only mild rhinitis.  No trouble breathing presently. oral: Lips and gums without lesions. Tongue and palate mucosa without lesions. Posterior oropharynx clear. Neck: No palpable adenopathy or masses.  No palpable tenderness or nodules noted in the left or right neck..  No carotid bruits. Respiratory: Breathing comfortably  Skin:  No facial/neck lesions or rash noted.  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 Chronic rhinitis  Plan: Suggested use of Nasacort 2 sprays each nostril at night and prescribed this. Also gave her samples of Lipo flavonoid to try as this is beneficial in some cases of tinnitus. Reviewed with her concerning using masking noise to help dealing with the tinnitus when it is quiet.  Radene Journey, MD

## 2019-08-18 ENCOUNTER — Encounter (INDEPENDENT_AMBULATORY_CARE_PROVIDER_SITE_OTHER): Payer: Self-pay

## 2019-09-14 ENCOUNTER — Ambulatory Visit: Payer: 59 | Attending: Internal Medicine

## 2019-09-14 DIAGNOSIS — Z23 Encounter for immunization: Secondary | ICD-10-CM | POA: Insufficient documentation

## 2019-09-14 NOTE — Progress Notes (Signed)
   Covid-19 Vaccination Clinic  Name:  Michelle Hammond    MRN: [id number] DOB: 1970/12/16  09/14/2019  Michelle Hammond was observed post Covid-19 immunization for 15 minutes without incident. She was provided with Vaccine Information Sheet and instruction to access the V-Safe system.   Michelle Hammond was instructed to call 911 with any severe reactions post vaccine: Marland Kitchen Difficulty breathing  . Swelling of face and throat  . A fast heartbeat  . A bad rash all over body  . Dizziness and weakness   Immunizations Administered    Name Date Dose VIS Date Route   Pfizer COVID-19 Vaccine 09/14/2019  3:00 PM 0.3 mL 06/20/2019 Intramuscular   Manufacturer: [place]   Lot: [id number]   [place]: [id number]

## 2019-10-14 ENCOUNTER — Ambulatory Visit: Payer: 59 | Attending: Internal Medicine

## 2019-10-14 DIAGNOSIS — Z23 Encounter for immunization: Secondary | ICD-10-CM

## 2019-10-14 NOTE — Progress Notes (Signed)
   Covid-19 Vaccination Clinic  Name:  Michelle Hammond    MRN: [id number] DOB: 1971-03-03  10/14/2019  [name] was observed post Covid-19 immunization for 15 minutes without incident. She was provided with Vaccine Information Sheet and instruction to access the V-Safe system.   [name] was instructed to call 911 with any severe reactions post vaccine: Marland Kitchen Difficulty breathing  . Swelling of face and throat  . A fast heartbeat  . A bad rash all over body  . Dizziness and weakness   Immunizations Administered    Name Date Dose VIS Date Route   Pfizer COVID-19 Vaccine 10/14/2019 12:46 PM 0.3 mL 06/20/2019 Intramuscular   Manufacturer: [place]   Lot: [id number]   [place]: [id number]

## 2019-11-05 ENCOUNTER — Encounter (INDEPENDENT_AMBULATORY_CARE_PROVIDER_SITE_OTHER): Payer: Self-pay

## 2019-11-05 NOTE — Progress Notes (Unsigned)
Faxed Rx refill request to [place] [place]. Flonase 0.05% Qty:16. Use 2 sprays in each nostril at night. W/6 refills. On 10/2019

## 2020-01-15 ENCOUNTER — Other Ambulatory Visit: Payer: Self-pay | Admitting: Internal Medicine

## 2020-01-15 DIAGNOSIS — M542 Cervicalgia: Secondary | ICD-10-CM

## 2020-01-15 DIAGNOSIS — R599 Enlarged lymph nodes, unspecified: Secondary | ICD-10-CM

## 2020-01-22 ENCOUNTER — Ambulatory Visit
Admission: RE | Admit: 2020-01-22 | Discharge: 2020-01-22 | Disposition: A | Discharge status: Home / Self Care | Payer: 59 | Source: Ambulatory Visit | Attending: Internal Medicine | Admitting: Internal Medicine

## 2020-01-22 DIAGNOSIS — M542 Cervicalgia: Secondary | ICD-10-CM

## 2020-01-22 DIAGNOSIS — R599 Enlarged lymph nodes, unspecified: Secondary | ICD-10-CM

## 2020-01-24 ENCOUNTER — Other Ambulatory Visit: Payer: Self-pay

## 2020-01-24 ENCOUNTER — Emergency Department (HOSPITAL_COMMUNITY)
Admission: EM | Admit: 2020-01-24 | Discharge: 2020-01-24 | Disposition: A | Discharge status: Home / Self Care | Payer: 59 | Attending: Emergency Medicine | Admitting: Emergency Medicine

## 2020-01-24 ENCOUNTER — Emergency Department (HOSPITAL_COMMUNITY): Payer: 59

## 2020-01-24 ENCOUNTER — Encounter (HOSPITAL_COMMUNITY): Payer: Self-pay | Admitting: Emergency Medicine

## 2020-01-24 DIAGNOSIS — J45909 Unspecified asthma, uncomplicated: Secondary | ICD-10-CM | POA: Insufficient documentation

## 2020-01-24 DIAGNOSIS — Z79899 Other long term (current) drug therapy: Secondary | ICD-10-CM | POA: Diagnosis not present

## 2020-01-24 DIAGNOSIS — N800 Endometriosis of uterus: Secondary | ICD-10-CM | POA: Insufficient documentation

## 2020-01-24 DIAGNOSIS — N8003 Adenomyosis of the uterus: Secondary | ICD-10-CM

## 2020-01-24 DIAGNOSIS — K625 Hemorrhage of anus and rectum: Secondary | ICD-10-CM | POA: Diagnosis present

## 2020-01-24 DIAGNOSIS — K5733 Diverticulitis of large intestine without perforation or abscess with bleeding: Secondary | ICD-10-CM | POA: Insufficient documentation

## 2020-01-24 LAB — URINALYSIS, ROUTINE W REFLEX MICROSCOPIC
Bilirubin Urine: NEGATIVE
Glucose, UA: NEGATIVE mg/dL
Hgb urine dipstick: NEGATIVE
Ketones, ur: NEGATIVE mg/dL
Leukocytes,Ua: NEGATIVE
Nitrite: NEGATIVE
Protein, ur: NEGATIVE mg/dL
Specific Gravity, Urine: 1.003 — ABNORMAL LOW (ref 1.005–1.030)
pH: 7 (ref 5.0–8.0)

## 2020-01-24 LAB — CBC
HCT: 38.2 % (ref 36.0–46.0)
Hemoglobin: 12.5 g/dL (ref 12.0–15.0)
MCH: 30.2 pg (ref 26.0–34.0)
MCHC: 32.7 g/dL (ref 30.0–36.0)
MCV: 92.3 fL (ref 80.0–100.0)
Platelets: 231 10*3/uL (ref 150–400)
RBC: 4.14 MIL/uL (ref 3.87–5.11)
RDW: 12.6 % (ref 11.5–15.5)
WBC: 5.6 10*3/uL (ref 4.0–10.5)
nRBC: 0 % (ref 0.0–0.2)

## 2020-01-24 LAB — COMPREHENSIVE METABOLIC PANEL
ALT: 23 U/L (ref 0–44)
AST: 30 U/L (ref 15–41)
Albumin: 3.8 g/dL (ref 3.5–5.0)
Alkaline Phosphatase: 50 U/L (ref 38–126)
Anion gap: 11 (ref 5–15)
BUN: 6 mg/dL (ref 6–20)
CO2: 24 mmol/L (ref 22–32)
Calcium: 8.4 mg/dL — ABNORMAL LOW (ref 8.9–10.3)
Chloride: 101 mmol/L (ref 98–111)
Creatinine, Ser: 0.6 mg/dL (ref 0.44–1.00)
GFR calc Af Amer: >60 mL/min (ref >60)
GFR calc non Af Amer: >60 mL/min (ref >60)
Glucose, Bld: 127 mg/dL — ABNORMAL HIGH (ref 70–99)
Potassium: 3.3 mmol/L — ABNORMAL LOW (ref 3.5–5.1)
Sodium: 136 mmol/L (ref 135–145)
Total Bilirubin: 0.5 mg/dL (ref 0.3–1.2)
Total Protein: 7.3 g/dL (ref 6.5–8.1)

## 2020-01-24 LAB — I-STAT BETA HCG BLOOD, ED (MC, WL, AP ONLY): I-stat hCG, quantitative: <5 m[IU]/mL (ref <5)

## 2020-01-24 LAB — POC OCCULT BLOOD, ED: Fecal Occult Bld: NEGATIVE

## 2020-01-24 LAB — LIPASE, BLOOD: Lipase: 29 U/L (ref 11–51)

## 2020-01-24 MED ORDER — AMOXICILLIN-POT CLAVULANATE 875-125 MG PO TABS: 1.0000 | ORAL_TABLET | Freq: Two times a day (BID) | ORAL | 0 refills | Status: AC

## 2020-01-24 MED ORDER — SODIUM CHLORIDE 0.9 % IV BOLUS
1000.0000 mL | Freq: Once | INTRAVENOUS | Status: AC
  Administered 2020-01-24: 1000 mL via INTRAVENOUS

## 2020-01-24 MED ORDER — SODIUM CHLORIDE 0.9% FLUSH: 3.0000 mL | Freq: Once | INTRAVENOUS | Status: DC

## 2020-01-24 MED ORDER — MORPHINE SULFATE (PF) 4 MG/ML IV SOLN
4.0000 mg | Freq: Once | INTRAVENOUS | Status: AC
  Administered 2020-01-24: 4 mg via INTRAVENOUS
  Filled 2020-01-24: qty 1

## 2020-01-24 MED ORDER — IOHEXOL 300 MG/ML  SOLN
100.0000 mL | Freq: Once | INTRAMUSCULAR | Status: AC | PRN
  Administered 2020-01-24: 100 mL via INTRAVENOUS

## 2020-01-24 NOTE — ED Triage Notes (Signed)
Pt. Stated, I have bleeding bright red in rectum 2 days. My stomach hurt every time I eat on the left side.

## 2020-01-24 NOTE — ED Provider Notes (Signed)
[facility] EMERGENCY DEPARTMENT Provider Note   CSN: [id number] Arrival date & time: 01/24/20  9211     History Chief Complaint  Patient presents with  . Rectal Bleeding  . Abdominal Pain    Michelle Hammond is a 49 y.o. female.  The history is provided by the patient and medical records. No language interpreter was used.  Rectal Bleeding Associated symptoms: abdominal pain   Abdominal Pain Associated symptoms: hematochezia      49 year old [place] female with history of thyroid cancer, hemorrhoids, GERD, presenting for evaluation of abdominal pain.  Patient mention she has had recurrent abdominal discomfort usually after eating.  States this is an ongoing issue for many years.  She mention she is a vegetarian, however after eating she report sensation of indigestions, increase burping, as well as having loose stools.  She also complaining of rectal irritation with persistent bowel movement.  For the past 2 days she also noticed bright red blood while having bowel movement.  She also endorsed increased frequency of bowel movement.  She is hungry but afraid to eat.  Her pain is localized primarily across her left lower abdomen.  She does not complain of fever chills no lightheadedness or dizziness no chest pain or shortness of breath no dysuria or hematuria no vaginal bleeding or vaginal discharge.  She has had a colonoscopy performed last year and reportedly had benign colon polyps.  She report history of hepatitis B.  No recent antibiotic use, no recent travel.  She also report being bruises easily.  Past Medical History:  Diagnosis Date  . Allergy    SEASONAL   . Asthma    NO LONGER USING INHALER  . Bruises easily   . Cancer [facility])    Thyroid cancer   . GERD (gastroesophageal reflux disease)    EVERY TIME I EAT I HAVE BURPING, INDIGESTION AND DIARRHEA  . Hemorrhoids    GI Dr. Penelope Coop  . Hepatitis B    chronic, referred to [facility] liver care  . Numbness     WHOLE RIGHT SIDE OF BODY FEELS NUMB AND PAINFUL,  AND PAIN LEFT SIDE OF BODY PAINFUL - BUT NO NUMBNESS LEFT SIDE - STATES SHE HAS HAD PROBLEM FOR YEARS- DOES NOT KNOW THE CAUSE OF HER NUMBNESS AND PAIN  . Shortness of breath   . Thyroid disease    SOMETIMES FAST HEART BEAT, MUSCLES TINGLE, SOMETIMES HARD TO SWALLOW,  AND FEELS LIKE NEEDS TO CLEAR THROAT    Patient Active Problem List   Diagnosis Date Noted  . Combined abdominal and pelvic pain 06/11/2015  . 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 Paresthesia of right leg 05/19/2014  . Headache 05/19/2014  . 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 Papillary thyroid carcinoma ([place]) 02/05/2014  . Bronchial asthma 04/29/2013  . Seasonal allergies 04/29/2013  . PCB (post coital bleeding) 10/18/2012    Past Surgical History:  Procedure Laterality Date  . LYMPH NODE DISSECTION N/A 02/05/2014   Procedure: CENTRAL LYMPH NODE DISSECTION;  Surgeon: Earnstine Regal, MD;  Location: [facility] ORS;  Service: General;  Laterality: N/A;  . NO PREVIOUS SURGERY    . THYROIDECTOMY N/A 02/05/2014   Procedure: TOTAL THYROIDECTOMY ;  Surgeon: Earnstine Regal, MD;  Location: [facility] ORS;  Service: General;  Laterality: N/A;     OB History    Gravida  1   Para  1   Term  1   Preterm      AB  Living  1     SAB      TAB      Ectopic      Multiple      Live Births              Family History  Problem Relation Age of Onset  . Hypertension Mother   . Kidney disease Father     Social History   Tobacco Use  . Smoking status: Never Smoker  . Smokeless tobacco: Never Used  Substance Use Topics  . Alcohol use: No    Alcohol/week: 0.0 standard drinks  . Drug use: No    Home Medications Prior to Admission medications   Medication Sig Start Date End Date Taking? Authorizing Provider  Calcium-Magnesium-Vitamin D (CALCIUM 500 PO) Take 2 tablets by mouth daily.    [provider]  Cholecalciferol (VITAMIN D-3) 5000 UNITS TABS Take  1 tablet by mouth daily. Reported on 07/20/2015    [provider]  ferrous sulfate 325 (65 FE) MG tablet Take 325 mg by mouth daily with breakfast.    [provider]  Glucosamine-Chondroitin-MSM (TRIPLE FLEX PO) Take by mouth. 2 capsules daily    [provider]  hydrocortisone (ANUSOL-HC) 25 MG suppository Place 1 suppository (25 mg total) rectally 2 (two) times daily. 04/28/15   Shawnee Knapp, MD  levothyroxine (SYNTHROID, LEVOTHROID) 100 MCG tablet Take 1 tablet (100 mcg total) by mouth every other day. Alternating with 88 mcg LT4 every other day. 08/09/15   Philemon Kingdom, MD  levothyroxine (SYNTHROID, LEVOTHROID) 88 MCG tablet Take 1 tablet (88 mcg total) by mouth every other day. 07/21/15   Philemon Kingdom, MD  olmesartan-hydrochlorothiazide (BENICAR HCT) 20-12.5 MG tablet Take 1 tablet by mouth daily.    [provider]  omeprazole (PRILOSEC) 20 MG capsule Take 1 capsule (20 mg total) by mouth 2 (two) times daily. 05/01/15   Shawnee Knapp, MD  OVER THE COUNTER MEDICATION Reported on 07/20/2015    [provider]    Allergies    Aspirin  Review of Systems   Review of Systems  Gastrointestinal: Positive for abdominal pain and hematochezia.  All other systems reviewed and are negative.   Physical Exam Updated Vital Signs BP (!) 129/100 (BP Location: Left Arm)   Pulse 92   Temp (!) 97.5 F (36.4 C) (Oral)   Resp 16   Ht 5\' 1"  (1.549 m)   Wt 57.2 kg   SpO2 97%   BMI 23.81 kg/m   Physical Exam Vitals and nursing note reviewed.  Constitutional:      General: She is not in acute distress.    Appearance: She is well-developed.  HENT:     Head: Atraumatic.  Eyes:     Conjunctiva/sclera: Conjunctivae normal.  Cardiovascular:     Rate and Rhythm: Normal rate and regular rhythm.     Heart sounds: Normal heart sounds.  Pulmonary:     Effort: Pulmonary effort is normal.     Breath sounds: Normal breath sounds.  Abdominal:      General: Abdomen is flat. Bowel sounds are normal.     Palpations: Abdomen is soft.     Tenderness: There is abdominal tenderness (Minimal left lower quadrant tenderness without guarding or rebound tenderness.) in the left lower quadrant.  Genitourinary:    Comments: Chaperone present during exam.  Normal rectal tone, no obvious mass, no thrombosed hemorrhoid, no anal fissure, no significant stool in rectal vault.  Normal color stool  on glove. Musculoskeletal:     Cervical back: Neck supple.  Skin:    Findings: No rash.  Neurological:     Mental Status: She is alert.     ED Results / Procedures / Treatments   Labs (all labs ordered are listed, but only abnormal results are displayed) Labs Reviewed  COMPREHENSIVE METABOLIC PANEL - Abnormal; Notable for the following components:      Result Value   Potassium 3.3 (*)    Glucose, Bld 127 (*)    Calcium 8.4 (*)    All other components within normal limits  URINALYSIS, ROUTINE W REFLEX MICROSCOPIC - Abnormal; Notable for the following components:   Color, Urine STRAW (*)    Specific Gravity, Urine 1.003 (*)    All other components within normal limits  LIPASE, BLOOD  CBC  I-STAT BETA HCG BLOOD, ED ([facility], [facility], [facility] ONLY)  POC OCCULT BLOOD, ED    EKG None  Radiology CT ABDOMEN PELVIS W CONTRAST  Result Date: 01/24/2020 CLINICAL DATA:  Left lower quadrant abdominal pain. EXAM: CT ABDOMEN AND PELVIS WITH CONTRAST TECHNIQUE: Multidetector CT imaging of the abdomen and pelvis was performed using the standard protocol following bolus administration of intravenous contrast. CONTRAST:  149mL OMNIPAQUE IOHEXOL 300 MG/ML  SOLN COMPARISON:  May 07, 2019 FINDINGS: Lower chest: No acute abnormality. Hepatobiliary: No focal liver abnormality is seen. No gallstones, gallbladder wall thickening, or biliary dilatation. Pancreas: Unremarkable. No pancreatic ductal dilatation or surrounding inflammatory changes. Spleen: Normal in size without focal  abnormality. Adrenals/Urinary Tract: Adrenal glands are unremarkable. Kidneys are normal, without renal calculi, focal lesion, or hydronephrosis. Bladder is unremarkable. Stomach/Bowel: Decompressed stomach and small bowel. Normal appendix. Long segments of circumferential mucosal thickening of the proximal transverse colon and sigmoid colon. Although there are scattered diverticula within the areas of mucosal thickening, it is not certain whether these inflammatory changes are due to diverticulitis or other form of colitis. No significant pericolonic inflammatory changes, or areas of micro rupture. Vascular/Lymphatic: No significant vascular findings are present. No enlarged abdominal or pelvic lymph nodes. Reproductive: Myometrial masses distort the endometrium. No definite adnexal masses are seen. Slight prominence of the left ovary. Other: No abdominal wall hernia or abnormality. No abdominopelvic ascites. Musculoskeletal: No acute or significant osseous findings. IMPRESSION: 1. Long segments of circumferential mucosal thickening of the proximal transverse colon and sigmoid colon. Although there are scattered diverticula within the areas of mucosal thickening, it is not certain whether these inflammatory changes are due to diverticulitis or other form of colitis. No significant pericolonic inflammatory changes, or areas of micro rupture. 2. Myometrial masses distort the endometrium. Slight prominence of the left ovary. Further evaluation with pelvic ultrasound may be considered. Electronically Signed   By: Fidela Salisbury M.D.   On: 01/24/2020 13:03    Procedures Procedures (including critical care time)  Medications Ordered in ED Medications  sodium chloride flush (NS) 0.9 % injection 3 mL (3 mLs Intravenous Not Given 01/24/20 1045)  sodium chloride 0.9 % bolus 1,000 mL (0 mLs Intravenous Stopped 01/24/20 1331)  morphine 4 MG/ML injection 4 mg (4 mg Intravenous Given 01/24/20 1118)  iohexol  (OMNIPAQUE) 300 MG/ML solution 100 mL (100 mLs Intravenous Contrast Given 01/24/20 1216)    ED Course  I have reviewed the triage vital signs and the nursing notes.  Pertinent labs & imaging results that were available during my care of the patient were reviewed by me and considered in my medical decision making (see chart for details).  MDM Rules/Calculators/A&P                          BP 124/84   Pulse 79   Temp (!) 97.5 F (36.4 C) (Oral)   Resp 16   Ht 5\' 1"  (1.549 m)   Wt 57.2 kg   SpO2 98%   BMI 23.81 kg/m   Final Clinical Impression(s) / ED Diagnoses Final diagnoses:  Diverticulitis of large intestine without perforation or abscess with bleeding  Myometrial endometriosis    Rx / DC Orders ED Discharge Orders         Ordered    amoxicillin-clavulanate (AUGMENTIN) 875-125 MG tablet  2 times daily     Discontinue  Reprint     01/24/20 1338         11:02 AM Patient here with left lower quadrant abdominal pain as well as rectal bleeding for the past few days.  Symptoms suggestive of potential diverticular bleed.  She has a fairly benign abdominal exam.  She also endorsed having recurrent abdominal pain after eating as well as having loose stools but this has been an ongoing issue for many years.  I suspect her symptom is likely related to IBS or some form of food allergies.  She is a vegetarian.  This can be evaluated further by her gastroenterologist.  Her last colonoscopy was last year.  1:35 PM Fecal occult blood test is negative, pregnancy test is negative, normal lipase, electrolyte panels are mostly reassuring, normal WBC, normal H&H, UA without signs of urinary tract infection and abdominal pelvis CT scan demonstrates long segment subsequent ventral mucosal thickening in the proximal transverse colon and sigmoid colon with scattered diverticula.  This finding may indicate diverticulitis or other form of colitis.  No evidence of pericolonic inflammation or micro  rupture.  This finding is consistent with patient presenting complaint, will treat with Augmentin.  Furthermore, myometrial masses distort the endometrium with slight prominence of the left ovary.  Further evaluation with pelvic ultrasound may be considered.  I discussed this finding with patient and recommend outpatient follow-up for pelvic ultrasound as recommended.  Return precaution discussed.  Otherwise she is stable for discharge.  All questions answered to patient satisfaction.   Domenic Moras, PA-C 01/24/20 1341    Malvin Johns, MD 01/24/20 1534

## 2020-01-24 NOTE — Discharge Instructions (Addendum)
You have been evaluated for your abdominal pain and rectal bleeding.  CT scan demonstrate evidence of diverticulitis.  Please take antibiotic as prescribed.  Furthermore, there is a myometrial mass that was found on the CT scan.  Discussed this with your primary care provider and request for a pelvic ultrasound for further evaluation.  Return to the ER if you have any concern.  You should also follow-up with your gastroenterologist for further evaluation of your recurrent indigestion. You may be experiencing certain food intolerance such as lactose intolerance, or Celiac sprue causing recurrent abdominal discomfort.  Your GI specialist may be able to help you with that.

## 2020-01-24 NOTE — ED Notes (Signed)
Discharge instructions reviewed with pt. Pt verbalized understanding. No further questions for ER staff at this time.

## 2020-02-04 ENCOUNTER — Other Ambulatory Visit: Payer: Self-pay | Admitting: Obstetrics and Gynecology

## 2020-02-04 ENCOUNTER — Other Ambulatory Visit: Payer: Self-pay | Admitting: Family Medicine

## 2020-02-04 DIAGNOSIS — N6002 Solitary cyst of left breast: Secondary | ICD-10-CM

## 2020-02-04 DIAGNOSIS — N644 Mastodynia: Secondary | ICD-10-CM

## 2020-02-19 ENCOUNTER — Other Ambulatory Visit: Payer: Self-pay | Admitting: Gastroenterology

## 2020-02-19 DIAGNOSIS — B181 Chronic viral hepatitis B without delta-agent: Secondary | ICD-10-CM

## 2020-02-20 ENCOUNTER — Ambulatory Visit
Admission: RE | Admit: 2020-02-20 | Discharge: 2020-02-20 | Disposition: A | Discharge status: Home / Self Care | Payer: 59 | Source: Ambulatory Visit | Attending: Obstetrics and Gynecology | Admitting: Obstetrics and Gynecology

## 2020-02-20 ENCOUNTER — Ambulatory Visit: Payer: 59

## 2020-02-20 ENCOUNTER — Other Ambulatory Visit: Payer: Self-pay

## 2020-02-20 DIAGNOSIS — N6002 Solitary cyst of left breast: Secondary | ICD-10-CM

## 2020-02-20 DIAGNOSIS — N644 Mastodynia: Secondary | ICD-10-CM

## 2020-02-27 ENCOUNTER — Ambulatory Visit
Admission: RE | Admit: 2020-02-27 | Discharge: 2020-02-27 | Disposition: A | Discharge status: Home / Self Care | Payer: 59 | Source: Ambulatory Visit | Attending: Gastroenterology | Admitting: Gastroenterology

## 2020-02-27 DIAGNOSIS — B181 Chronic viral hepatitis B without delta-agent: Secondary | ICD-10-CM

## 2020-08-31 ENCOUNTER — Other Ambulatory Visit: Payer: Self-pay | Admitting: Gastroenterology

## 2020-08-31 DIAGNOSIS — B181 Chronic viral hepatitis B without delta-agent: Secondary | ICD-10-CM

## 2020-09-01 ENCOUNTER — Other Ambulatory Visit: Payer: 59

## 2020-09-14 ENCOUNTER — Ambulatory Visit
Admission: RE | Admit: 2020-09-14 | Discharge: 2020-09-14 | Disposition: A | Discharge status: Home / Self Care | Payer: 59 | Source: Ambulatory Visit | Attending: Gastroenterology | Admitting: Gastroenterology

## 2020-09-14 DIAGNOSIS — B181 Chronic viral hepatitis B without delta-agent: Secondary | ICD-10-CM

## 2021-02-03 ENCOUNTER — Other Ambulatory Visit: Payer: Self-pay | Admitting: Obstetrics and Gynecology

## 2021-03-01 ENCOUNTER — Other Ambulatory Visit: Payer: Self-pay | Admitting: Obstetrics and Gynecology

## 2021-03-01 DIAGNOSIS — Z1231 Encounter for screening mammogram for malignant neoplasm of breast: Secondary | ICD-10-CM

## 2021-03-03 ENCOUNTER — Other Ambulatory Visit: Payer: Self-pay | Admitting: Gastroenterology

## 2021-03-03 DIAGNOSIS — B181 Chronic viral hepatitis B without delta-agent: Secondary | ICD-10-CM

## 2021-03-15 ENCOUNTER — Ambulatory Visit
Admission: RE | Admit: 2021-03-15 | Discharge: 2021-03-15 | Disposition: A | Discharge status: Home / Self Care | Payer: 59 | Source: Ambulatory Visit | Attending: Gastroenterology | Admitting: Gastroenterology

## 2021-03-15 DIAGNOSIS — B181 Chronic viral hepatitis B without delta-agent: Secondary | ICD-10-CM

## 2021-03-24 ENCOUNTER — Other Ambulatory Visit: Payer: Self-pay

## 2021-03-24 ENCOUNTER — Ambulatory Visit
Admission: RE | Admit: 2021-03-24 | Discharge: 2021-03-24 | Disposition: A | Discharge status: Home / Self Care | Payer: 59 | Source: Ambulatory Visit | Attending: Obstetrics and Gynecology | Admitting: Obstetrics and Gynecology

## 2021-03-24 DIAGNOSIS — Z1231 Encounter for screening mammogram for malignant neoplasm of breast: Secondary | ICD-10-CM

## 2022-03-16 ENCOUNTER — Other Ambulatory Visit: Payer: Self-pay | Admitting: Internal Medicine

## 2022-03-16 DIAGNOSIS — Z13820 Encounter for screening for osteoporosis: Secondary | ICD-10-CM

## 2022-03-23 ENCOUNTER — Other Ambulatory Visit: Payer: Self-pay | Admitting: Obstetrics and Gynecology

## 2022-03-23 DIAGNOSIS — Z1231 Encounter for screening mammogram for malignant neoplasm of breast: Secondary | ICD-10-CM

## 2022-03-27 ENCOUNTER — Ambulatory Visit
Admission: RE | Admit: 2022-03-27 | Discharge: 2022-03-27 | Disposition: A | Discharge status: Home / Self Care | Payer: 59 | Source: Ambulatory Visit | Attending: Obstetrics and Gynecology | Admitting: Obstetrics and Gynecology

## 2022-03-27 DIAGNOSIS — Z1231 Encounter for screening mammogram for malignant neoplasm of breast: Secondary | ICD-10-CM | POA: Diagnosis not present

## 2022-04-05 ENCOUNTER — Ambulatory Visit
Admission: RE | Admit: 2022-04-05 | Discharge: 2022-04-05 | Disposition: A | Discharge status: Home / Self Care | Payer: 59 | Source: Ambulatory Visit | Attending: Internal Medicine | Admitting: Internal Medicine

## 2022-04-05 DIAGNOSIS — Z13820 Encounter for screening for osteoporosis: Secondary | ICD-10-CM

## 2022-06-21 DIAGNOSIS — K219 Gastro-esophageal reflux disease without esophagitis: Secondary | ICD-10-CM | POA: Diagnosis not present

## 2022-07-12 ENCOUNTER — Other Ambulatory Visit: Payer: Self-pay | Admitting: Physician Assistant

## 2022-07-12 DIAGNOSIS — R69 Illness, unspecified: Secondary | ICD-10-CM | POA: Diagnosis not present

## 2022-07-12 DIAGNOSIS — K582 Mixed irritable bowel syndrome: Secondary | ICD-10-CM

## 2022-07-12 DIAGNOSIS — K219 Gastro-esophageal reflux disease without esophagitis: Secondary | ICD-10-CM

## 2022-07-21 DIAGNOSIS — K625 Hemorrhage of anus and rectum: Secondary | ICD-10-CM | POA: Diagnosis not present

## 2022-07-26 ENCOUNTER — Ambulatory Visit
Admission: RE | Admit: 2022-07-26 | Discharge: 2022-07-26 | Disposition: A | Discharge status: Home / Self Care | Payer: 59 | Source: Ambulatory Visit | Attending: Physician Assistant | Admitting: Physician Assistant

## 2022-07-26 DIAGNOSIS — K219 Gastro-esophageal reflux disease without esophagitis: Secondary | ICD-10-CM

## 2022-07-26 DIAGNOSIS — K582 Mixed irritable bowel syndrome: Secondary | ICD-10-CM

## 2022-07-26 DIAGNOSIS — R109 Unspecified abdominal pain: Secondary | ICD-10-CM | POA: Diagnosis not present

## 2022-09-18 DIAGNOSIS — K580 Irritable bowel syndrome with diarrhea: Secondary | ICD-10-CM | POA: Diagnosis not present

## 2022-09-18 DIAGNOSIS — B181 Chronic viral hepatitis B without delta-agent: Secondary | ICD-10-CM | POA: Diagnosis not present

## 2022-09-18 DIAGNOSIS — K219 Gastro-esophageal reflux disease without esophagitis: Secondary | ICD-10-CM | POA: Diagnosis not present

## 2022-11-13 DIAGNOSIS — N390 Urinary tract infection, site not specified: Secondary | ICD-10-CM | POA: Diagnosis not present

## 2022-11-13 DIAGNOSIS — L255 Unspecified contact dermatitis due to plants, except food: Secondary | ICD-10-CM | POA: Diagnosis not present

## 2022-11-13 DIAGNOSIS — R310 Gross hematuria: Secondary | ICD-10-CM | POA: Diagnosis not present

## 2022-11-30 DIAGNOSIS — R319 Hematuria, unspecified: Secondary | ICD-10-CM | POA: Diagnosis not present

## 2023-01-02 DIAGNOSIS — M65831 Other synovitis and tenosynovitis, right forearm: Secondary | ICD-10-CM | POA: Diagnosis not present

## 2023-01-02 DIAGNOSIS — M25531 Pain in right wrist: Secondary | ICD-10-CM | POA: Diagnosis not present

## 2023-01-22 ENCOUNTER — Other Ambulatory Visit: Payer: Self-pay | Admitting: Internal Medicine

## 2023-01-22 DIAGNOSIS — Z85850 Personal history of malignant neoplasm of thyroid: Secondary | ICD-10-CM | POA: Diagnosis not present

## 2023-01-22 DIAGNOSIS — E890 Postprocedural hypothyroidism: Secondary | ICD-10-CM | POA: Diagnosis not present

## 2023-02-06 ENCOUNTER — Ambulatory Visit
Admission: RE | Admit: 2023-02-06 | Discharge: 2023-02-06 | Disposition: A | Discharge status: Home / Self Care | Payer: 59 | Source: Ambulatory Visit | Attending: Internal Medicine | Admitting: Internal Medicine

## 2023-02-06 DIAGNOSIS — R221 Localized swelling, mass and lump, neck: Secondary | ICD-10-CM | POA: Diagnosis not present

## 2023-02-06 DIAGNOSIS — Z85850 Personal history of malignant neoplasm of thyroid: Secondary | ICD-10-CM

## 2023-02-13 ENCOUNTER — Other Ambulatory Visit (HOSPITAL_COMMUNITY)
Admission: RE | Admit: 2023-02-13 | Discharge: 2023-02-13 | Disposition: A | Discharge status: Home / Self Care | Payer: 59 | Source: Ambulatory Visit | Attending: Obstetrics and Gynecology | Admitting: Obstetrics and Gynecology

## 2023-02-13 DIAGNOSIS — Z01419 Encounter for gynecological examination (general) (routine) without abnormal findings: Secondary | ICD-10-CM | POA: Diagnosis not present

## 2023-02-13 DIAGNOSIS — N644 Mastodynia: Secondary | ICD-10-CM | POA: Diagnosis not present

## 2023-02-15 LAB — CYTOLOGY - PAP
Comment: NEGATIVE
Diagnosis: NEGATIVE
High risk HPV: NEGATIVE

## 2023-02-26 ENCOUNTER — Other Ambulatory Visit: Payer: Self-pay | Admitting: Family Medicine

## 2023-02-26 DIAGNOSIS — Z1231 Encounter for screening mammogram for malignant neoplasm of breast: Secondary | ICD-10-CM

## 2023-02-28 DIAGNOSIS — M25569 Pain in unspecified knee: Secondary | ICD-10-CM | POA: Diagnosis not present

## 2023-02-28 DIAGNOSIS — R7309 Other abnormal glucose: Secondary | ICD-10-CM | POA: Diagnosis not present

## 2023-02-28 DIAGNOSIS — N644 Mastodynia: Secondary | ICD-10-CM | POA: Diagnosis not present

## 2023-03-02 ENCOUNTER — Other Ambulatory Visit: Payer: Self-pay | Admitting: Family Medicine

## 2023-03-02 DIAGNOSIS — N644 Mastodynia: Secondary | ICD-10-CM

## 2023-03-27 DIAGNOSIS — M170 Bilateral primary osteoarthritis of knee: Secondary | ICD-10-CM | POA: Diagnosis not present

## 2023-03-27 DIAGNOSIS — M1711 Unilateral primary osteoarthritis, right knee: Secondary | ICD-10-CM | POA: Diagnosis not present

## 2023-03-27 DIAGNOSIS — M5416 Radiculopathy, lumbar region: Secondary | ICD-10-CM | POA: Diagnosis not present

## 2023-03-27 DIAGNOSIS — M7061 Trochanteric bursitis, right hip: Secondary | ICD-10-CM | POA: Diagnosis not present

## 2023-04-11 ENCOUNTER — Ambulatory Visit
Admission: RE | Admit: 2023-04-11 | Discharge: 2023-04-11 | Disposition: A | Discharge status: Home / Self Care | Payer: 59 | Source: Ambulatory Visit | Attending: Family Medicine | Admitting: Family Medicine

## 2023-04-11 ENCOUNTER — Ambulatory Visit: Payer: 59

## 2023-04-11 ENCOUNTER — Other Ambulatory Visit: Payer: Self-pay | Admitting: Family Medicine

## 2023-04-11 DIAGNOSIS — N644 Mastodynia: Secondary | ICD-10-CM

## 2023-04-11 DIAGNOSIS — N6311 Unspecified lump in the right breast, upper outer quadrant: Secondary | ICD-10-CM | POA: Diagnosis not present

## 2023-04-11 DIAGNOSIS — N6310 Unspecified lump in the right breast, unspecified quadrant: Secondary | ICD-10-CM

## 2023-04-16 DIAGNOSIS — M47816 Spondylosis without myelopathy or radiculopathy, lumbar region: Secondary | ICD-10-CM | POA: Diagnosis not present

## 2023-04-16 DIAGNOSIS — M545 Low back pain, unspecified: Secondary | ICD-10-CM | POA: Diagnosis not present

## 2023-04-16 DIAGNOSIS — M5416 Radiculopathy, lumbar region: Secondary | ICD-10-CM | POA: Diagnosis not present

## 2023-04-17 DIAGNOSIS — M25562 Pain in left knee: Secondary | ICD-10-CM | POA: Diagnosis not present

## 2023-04-17 DIAGNOSIS — M25561 Pain in right knee: Secondary | ICD-10-CM | POA: Diagnosis not present

## 2023-04-20 DIAGNOSIS — M25562 Pain in left knee: Secondary | ICD-10-CM | POA: Diagnosis not present

## 2023-04-20 DIAGNOSIS — M25561 Pain in right knee: Secondary | ICD-10-CM | POA: Diagnosis not present

## 2023-04-23 DIAGNOSIS — M25562 Pain in left knee: Secondary | ICD-10-CM | POA: Diagnosis not present

## 2023-04-23 DIAGNOSIS — M25561 Pain in right knee: Secondary | ICD-10-CM | POA: Diagnosis not present

## 2023-04-27 DIAGNOSIS — M25561 Pain in right knee: Secondary | ICD-10-CM | POA: Diagnosis not present

## 2023-04-27 DIAGNOSIS — M25562 Pain in left knee: Secondary | ICD-10-CM | POA: Diagnosis not present

## 2023-05-01 DIAGNOSIS — M25562 Pain in left knee: Secondary | ICD-10-CM | POA: Diagnosis not present

## 2023-05-01 DIAGNOSIS — M25561 Pain in right knee: Secondary | ICD-10-CM | POA: Diagnosis not present

## 2023-05-04 DIAGNOSIS — M25562 Pain in left knee: Secondary | ICD-10-CM | POA: Diagnosis not present

## 2023-05-04 DIAGNOSIS — M25561 Pain in right knee: Secondary | ICD-10-CM | POA: Diagnosis not present

## 2023-05-11 ENCOUNTER — Other Ambulatory Visit: Payer: Self-pay | Admitting: Gastroenterology

## 2023-05-11 DIAGNOSIS — B181 Chronic viral hepatitis B without delta-agent: Secondary | ICD-10-CM

## 2023-05-11 DIAGNOSIS — R768 Other specified abnormal immunological findings in serum: Secondary | ICD-10-CM

## 2023-05-14 DIAGNOSIS — B181 Chronic viral hepatitis B without delta-agent: Secondary | ICD-10-CM | POA: Diagnosis not present

## 2023-05-18 ENCOUNTER — Ambulatory Visit
Admission: RE | Admit: 2023-05-18 | Discharge: 2023-05-18 | Disposition: A | Discharge status: Home / Self Care | Payer: 59 | Source: Ambulatory Visit | Attending: Gastroenterology | Admitting: Gastroenterology

## 2023-05-18 DIAGNOSIS — R768 Other specified abnormal immunological findings in serum: Secondary | ICD-10-CM

## 2023-05-18 DIAGNOSIS — B1910 Unspecified viral hepatitis B without hepatic coma: Secondary | ICD-10-CM | POA: Diagnosis not present

## 2023-05-18 DIAGNOSIS — B181 Chronic viral hepatitis B without delta-agent: Secondary | ICD-10-CM

## 2023-07-13 ENCOUNTER — Ambulatory Visit
Admission: RE | Admit: 2023-07-13 | Discharge: 2023-07-13 | Disposition: A | Discharge status: Home / Self Care | Payer: 59 | Source: Ambulatory Visit | Attending: Family Medicine | Admitting: Family Medicine

## 2023-07-13 DIAGNOSIS — N6310 Unspecified lump in the right breast, unspecified quadrant: Secondary | ICD-10-CM

## 2024-01-25 ENCOUNTER — Other Ambulatory Visit: Payer: Self-pay | Admitting: Gastroenterology

## 2024-01-25 DIAGNOSIS — B181 Chronic viral hepatitis B without delta-agent: Secondary | ICD-10-CM

## 2024-02-05 ENCOUNTER — Other Ambulatory Visit

## 2024-02-06 ENCOUNTER — Ambulatory Visit
Admission: RE | Admit: 2024-02-06 | Discharge: 2024-02-06 | Disposition: A | Discharge status: Home / Self Care | Source: Ambulatory Visit | Attending: Gastroenterology | Admitting: Gastroenterology

## 2024-02-06 DIAGNOSIS — B181 Chronic viral hepatitis B without delta-agent: Secondary | ICD-10-CM

## 2024-03-12 ENCOUNTER — Other Ambulatory Visit: Payer: Self-pay | Admitting: Obstetrics and Gynecology

## 2024-03-12 DIAGNOSIS — Z1231 Encounter for screening mammogram for malignant neoplasm of breast: Secondary | ICD-10-CM

## 2024-04-11 ENCOUNTER — Ambulatory Visit

## 2024-04-18 ENCOUNTER — Ambulatory Visit
Admission: RE | Admit: 2024-04-18 | Discharge: 2024-04-18 | Disposition: A | Discharge status: Home / Self Care | Source: Ambulatory Visit | Attending: Obstetrics and Gynecology | Admitting: Obstetrics and Gynecology

## 2024-04-18 DIAGNOSIS — Z1231 Encounter for screening mammogram for malignant neoplasm of breast: Secondary | ICD-10-CM
